# Patient Record
Sex: Female | Born: 1940
Health system: Southern US, Community
[De-identification: ages and names within clinical notes are randomized; demographics above are authoritative.]

## PROBLEM LIST (undated history)

## (undated) DIAGNOSIS — E079 Disorder of thyroid, unspecified: Secondary | ICD-10-CM

## (undated) DIAGNOSIS — Z923 Personal history of irradiation: Secondary | ICD-10-CM

## (undated) DIAGNOSIS — C50919 Malignant neoplasm of unspecified site of unspecified female breast: Secondary | ICD-10-CM

## (undated) DIAGNOSIS — M1990 Unspecified osteoarthritis, unspecified site: Secondary | ICD-10-CM

## (undated) DIAGNOSIS — J45909 Unspecified asthma, uncomplicated: Secondary | ICD-10-CM

## (undated) DIAGNOSIS — E039 Hypothyroidism, unspecified: Secondary | ICD-10-CM

## (undated) DIAGNOSIS — D649 Anemia, unspecified: Secondary | ICD-10-CM

## (undated) DIAGNOSIS — R112 Nausea with vomiting, unspecified: Secondary | ICD-10-CM

## (undated) DIAGNOSIS — K759 Inflammatory liver disease, unspecified: Secondary | ICD-10-CM

## (undated) DIAGNOSIS — Z9889 Other specified postprocedural states: Secondary | ICD-10-CM

## (undated) DIAGNOSIS — I10 Essential (primary) hypertension: Secondary | ICD-10-CM

## (undated) DIAGNOSIS — J189 Pneumonia, unspecified organism: Secondary | ICD-10-CM

## (undated) DIAGNOSIS — E7800 Pure hypercholesterolemia, unspecified: Secondary | ICD-10-CM

## (undated) HISTORY — PX: ABDOMINAL HYSTERECTOMY: SHX81

## (undated) HISTORY — PX: COLONOSCOPY: SHX174

## (undated) HISTORY — PX: CATARACT EXTRACTION W/ INTRAOCULAR LENS IMPLANT: SHX1309

## (undated) HISTORY — PX: TONSILLECTOMY: SUR1361

---
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
## 1971-09-14 HISTORY — PX: THYROIDECTOMY, PARTIAL: SHX18

## 1988-09-13 DIAGNOSIS — Z923 Personal history of irradiation: Secondary | ICD-10-CM

## 1988-09-13 DIAGNOSIS — C50919 Malignant neoplasm of unspecified site of unspecified female breast: Secondary | ICD-10-CM

## 1988-09-13 HISTORY — PX: BREAST LUMPECTOMY: SHX2

## 1988-09-13 HISTORY — DX: Malignant neoplasm of unspecified site of unspecified female breast: C50.919

## 1988-09-13 HISTORY — DX: Personal history of irradiation: Z92.3

## 1998-03-14 ENCOUNTER — Ambulatory Visit: Admission: RE | Admit: 1998-03-14 | Discharge: 1998-03-14 | Payer: Self-pay | Admitting: Family Medicine

## 1999-04-23 ENCOUNTER — Ambulatory Visit (HOSPITAL_COMMUNITY): Admission: RE | Admit: 1999-04-23 | Discharge: 1999-04-23 | Payer: Self-pay | Admitting: Family Medicine

## 1999-04-23 ENCOUNTER — Encounter: Payer: Self-pay | Admitting: Family Medicine

## 2000-04-26 ENCOUNTER — Ambulatory Visit (HOSPITAL_COMMUNITY): Admission: RE | Admit: 2000-04-26 | Discharge: 2000-04-26 | Payer: Self-pay | Admitting: Family Medicine

## 2000-04-26 ENCOUNTER — Encounter: Payer: Self-pay | Admitting: Family Medicine

## 2001-05-01 ENCOUNTER — Ambulatory Visit (HOSPITAL_COMMUNITY): Admission: RE | Admit: 2001-05-01 | Discharge: 2001-05-01 | Payer: Self-pay | Admitting: Family Medicine

## 2001-05-01 ENCOUNTER — Encounter: Payer: Self-pay | Admitting: Family Medicine

## 2002-05-08 ENCOUNTER — Ambulatory Visit (HOSPITAL_COMMUNITY): Admission: RE | Admit: 2002-05-08 | Discharge: 2002-05-08 | Payer: Self-pay | Admitting: Family Medicine

## 2002-05-08 ENCOUNTER — Encounter: Payer: Self-pay | Admitting: Family Medicine

## 2003-05-10 ENCOUNTER — Encounter: Payer: Self-pay | Admitting: Family Medicine

## 2003-05-10 ENCOUNTER — Ambulatory Visit (HOSPITAL_COMMUNITY): Admission: RE | Admit: 2003-05-10 | Discharge: 2003-05-10 | Payer: Self-pay | Admitting: Family Medicine

## 2004-05-26 ENCOUNTER — Ambulatory Visit (HOSPITAL_COMMUNITY): Admission: RE | Admit: 2004-05-26 | Discharge: 2004-05-26 | Payer: Self-pay | Admitting: Family Medicine

## 2005-07-14 ENCOUNTER — Ambulatory Visit (HOSPITAL_COMMUNITY): Admission: RE | Admit: 2005-07-14 | Discharge: 2005-07-14 | Payer: Self-pay | Admitting: Family Medicine

## 2006-08-08 ENCOUNTER — Ambulatory Visit (HOSPITAL_COMMUNITY): Admission: RE | Admit: 2006-08-08 | Discharge: 2006-08-08 | Payer: Self-pay | Admitting: Family Medicine

## 2007-08-16 ENCOUNTER — Ambulatory Visit (HOSPITAL_COMMUNITY): Admission: RE | Admit: 2007-08-16 | Discharge: 2007-08-16 | Payer: Self-pay | Admitting: Family Medicine

## 2008-08-20 ENCOUNTER — Ambulatory Visit (HOSPITAL_COMMUNITY): Admission: RE | Admit: 2008-08-20 | Discharge: 2008-08-20 | Payer: Self-pay | Admitting: Family Medicine

## 2009-09-01 ENCOUNTER — Ambulatory Visit (HOSPITAL_COMMUNITY): Admission: RE | Admit: 2009-09-01 | Discharge: 2009-09-01 | Payer: Self-pay | Admitting: Family Medicine

## 2010-09-08 ENCOUNTER — Ambulatory Visit (HOSPITAL_COMMUNITY)
Admission: RE | Admit: 2010-09-08 | Discharge: 2010-09-08 | Payer: Self-pay | Source: Home / Self Care | Attending: Family Medicine | Admitting: Family Medicine

## 2011-08-04 ENCOUNTER — Other Ambulatory Visit (HOSPITAL_COMMUNITY): Payer: Self-pay | Admitting: Family Medicine

## 2011-08-04 DIAGNOSIS — Z1231 Encounter for screening mammogram for malignant neoplasm of breast: Secondary | ICD-10-CM

## 2011-09-10 ENCOUNTER — Ambulatory Visit (HOSPITAL_COMMUNITY)
Admission: RE | Admit: 2011-09-10 | Discharge: 2011-09-10 | Disposition: A | Discharge status: Home / Self Care | Payer: Medicare Other | Source: Ambulatory Visit | Attending: Family Medicine | Admitting: Family Medicine

## 2011-09-10 DIAGNOSIS — Z1231 Encounter for screening mammogram for malignant neoplasm of breast: Secondary | ICD-10-CM | POA: Insufficient documentation

## 2011-09-15 ENCOUNTER — Other Ambulatory Visit: Payer: Self-pay | Admitting: Family Medicine

## 2011-09-15 DIAGNOSIS — R221 Localized swelling, mass and lump, neck: Secondary | ICD-10-CM

## 2011-09-16 ENCOUNTER — Ambulatory Visit
Admission: RE | Admit: 2011-09-16 | Discharge: 2011-09-16 | Disposition: A | Discharge status: Home / Self Care | Payer: Medicare Other | Source: Ambulatory Visit | Attending: Family Medicine | Admitting: Family Medicine

## 2011-09-16 DIAGNOSIS — R221 Localized swelling, mass and lump, neck: Secondary | ICD-10-CM

## 2011-09-21 ENCOUNTER — Other Ambulatory Visit: Payer: Self-pay | Admitting: Otolaryngology

## 2011-09-21 ENCOUNTER — Other Ambulatory Visit (HOSPITAL_COMMUNITY)
Admission: RE | Admit: 2011-09-21 | Discharge: 2011-09-21 | Disposition: A | Discharge status: Home / Self Care | Payer: Medicare Other | Source: Ambulatory Visit | Attending: Otolaryngology | Admitting: Otolaryngology

## 2011-09-21 DIAGNOSIS — R220 Localized swelling, mass and lump, head: Secondary | ICD-10-CM | POA: Insufficient documentation

## 2011-10-11 ENCOUNTER — Other Ambulatory Visit: Payer: Self-pay | Admitting: Otolaryngology

## 2011-10-21 ENCOUNTER — Ambulatory Visit
Admission: RE | Admit: 2011-10-21 | Discharge: 2011-10-21 | Disposition: A | Discharge status: Home / Self Care | Payer: Medicare Other | Source: Ambulatory Visit | Attending: Otolaryngology | Admitting: Otolaryngology

## 2011-10-21 ENCOUNTER — Other Ambulatory Visit: Payer: Self-pay | Admitting: Otolaryngology

## 2011-10-21 DIAGNOSIS — D333 Benign neoplasm of cranial nerves: Secondary | ICD-10-CM

## 2011-10-21 MED ORDER — IOHEXOL 300 MG/ML  SOLN
75.0000 mL | Freq: Once | INTRAMUSCULAR | Status: AC | PRN
  Administered 2011-10-21: 75 mL via INTRAVENOUS

## 2012-05-10 ENCOUNTER — Other Ambulatory Visit: Payer: Self-pay | Admitting: Otolaryngology

## 2012-05-10 DIAGNOSIS — R591 Generalized enlarged lymph nodes: Secondary | ICD-10-CM

## 2012-05-19 ENCOUNTER — Ambulatory Visit
Admission: RE | Admit: 2012-05-19 | Discharge: 2012-05-19 | Disposition: A | Discharge status: Home / Self Care | Payer: Medicare Other | Source: Ambulatory Visit | Attending: Otolaryngology | Admitting: Otolaryngology

## 2012-05-19 DIAGNOSIS — R591 Generalized enlarged lymph nodes: Secondary | ICD-10-CM

## 2012-05-19 MED ORDER — IOHEXOL 300 MG/ML  SOLN
75.0000 mL | Freq: Once | INTRAMUSCULAR | Status: AC | PRN
  Administered 2012-05-19: 75 mL via INTRAVENOUS

## 2012-06-09 ENCOUNTER — Other Ambulatory Visit: Payer: Self-pay | Admitting: Family Medicine

## 2012-06-09 DIAGNOSIS — R0989 Other specified symptoms and signs involving the circulatory and respiratory systems: Secondary | ICD-10-CM

## 2012-06-19 ENCOUNTER — Ambulatory Visit
Admission: RE | Admit: 2012-06-19 | Discharge: 2012-06-19 | Disposition: A | Discharge status: Home / Self Care | Payer: Medicare Other | Source: Ambulatory Visit | Attending: Family Medicine | Admitting: Family Medicine

## 2012-06-19 DIAGNOSIS — R0989 Other specified symptoms and signs involving the circulatory and respiratory systems: Secondary | ICD-10-CM

## 2012-08-14 ENCOUNTER — Other Ambulatory Visit (HOSPITAL_COMMUNITY): Payer: Self-pay | Admitting: Family Medicine

## 2012-08-14 DIAGNOSIS — Z1231 Encounter for screening mammogram for malignant neoplasm of breast: Secondary | ICD-10-CM

## 2012-09-11 ENCOUNTER — Ambulatory Visit (HOSPITAL_COMMUNITY)
Admission: RE | Admit: 2012-09-11 | Discharge: 2012-09-11 | Disposition: A | Discharge status: Home / Self Care | Payer: Medicare Other | Source: Ambulatory Visit | Attending: Family Medicine | Admitting: Family Medicine

## 2012-09-11 DIAGNOSIS — Z1231 Encounter for screening mammogram for malignant neoplasm of breast: Secondary | ICD-10-CM | POA: Insufficient documentation

## 2013-02-08 IMAGING — US US CAROTID DUPLEX BILAT
1 series · 13 of 24 positions shown · non-contrast
Comparison: Neck CT 05/19/2012, thyroid ultrasound 09/16/2011

CLINICAL DATA: Left carotid bruit.  History of a nerve sheath
tumor.

BILATERAL CAROTID DUPLEX ULTRASOUND
TECHNIQUE: Gray scale imaging, color Doppler and duplex ultrasound
was performed of bilateral carotid and vertebral arteries in the
neck.

[Series 1: us carotid duplex bilat · 0.08mm/px · 13 of 73 slices shown]
[im 1/73]
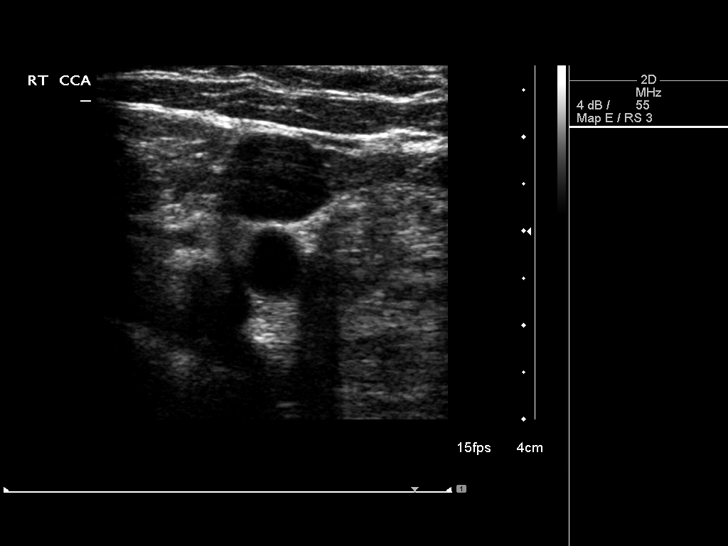
[im 7/73]
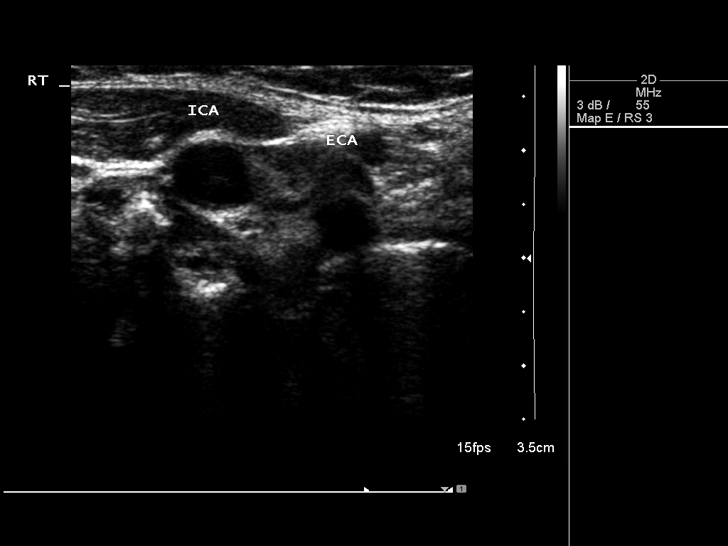
[im 13/73]
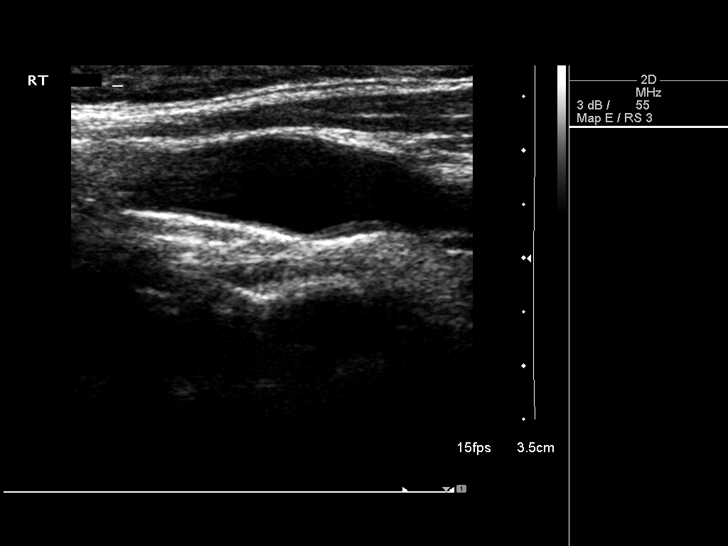
[im 19/73]
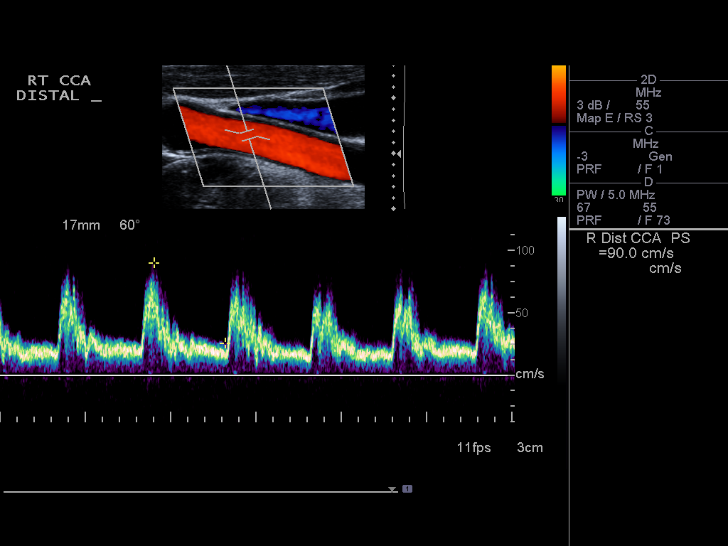
[im 26/73]
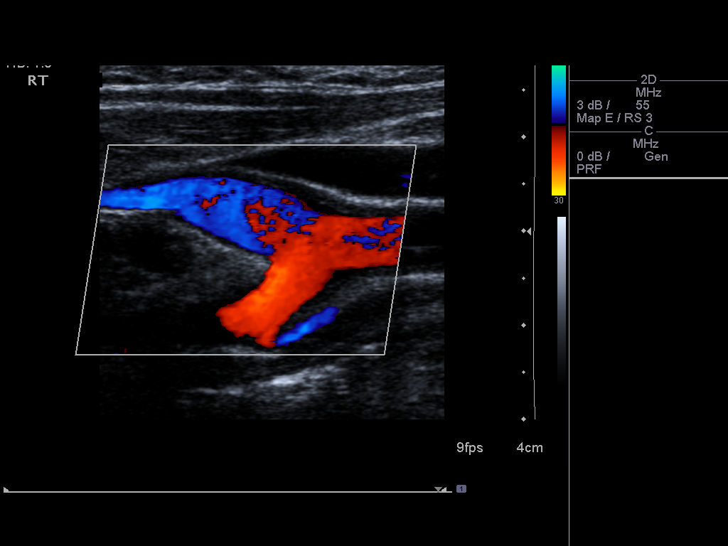
[im 32/73]
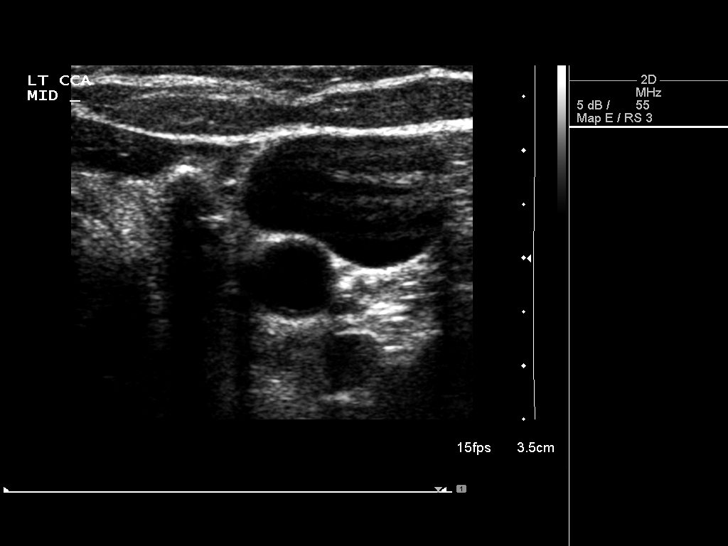
[im 38/73]
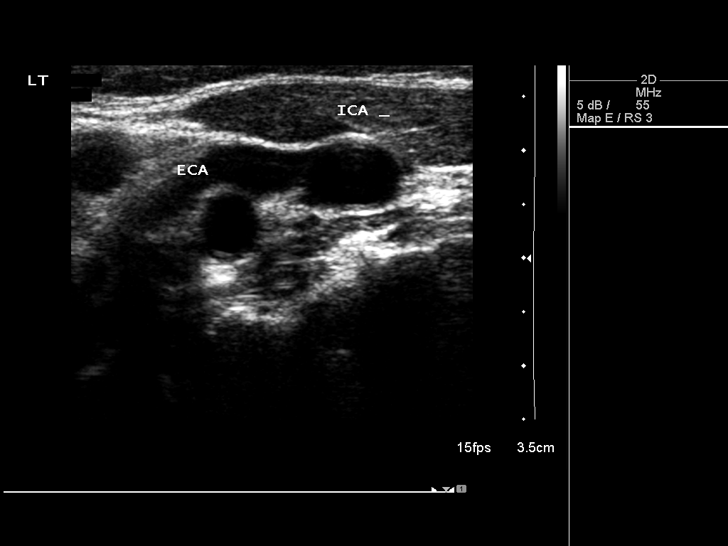
[im 41/73]
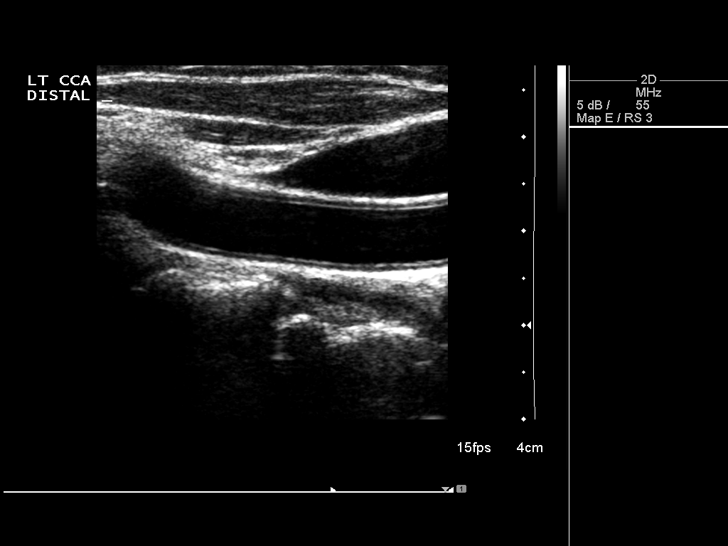
[im 47/73]
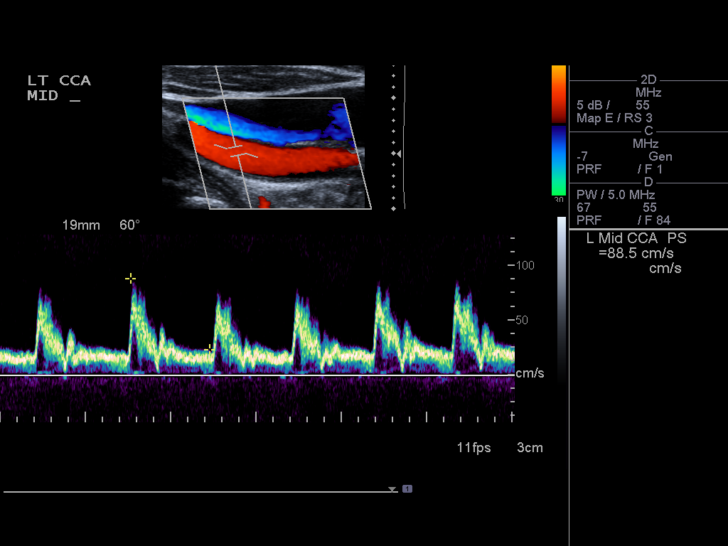
[im 54/73]
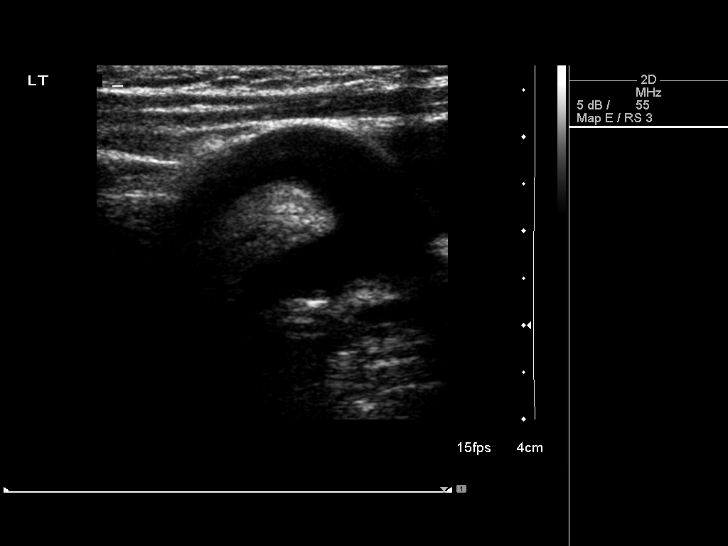
[im 60/73]
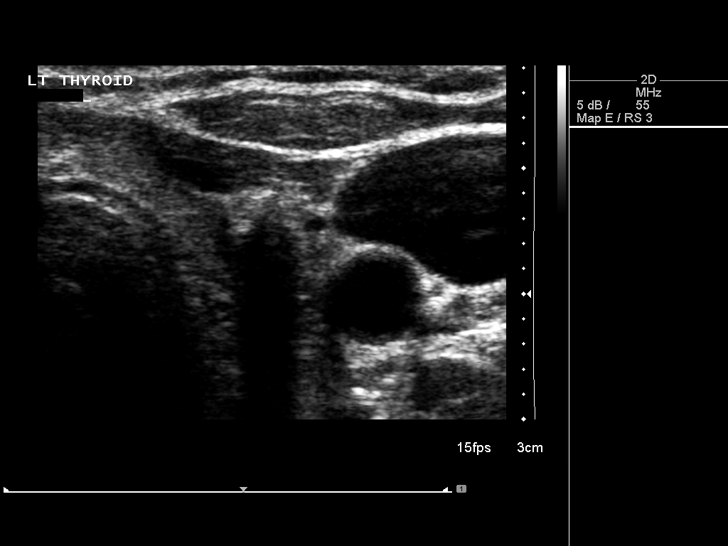
[im 66/73]
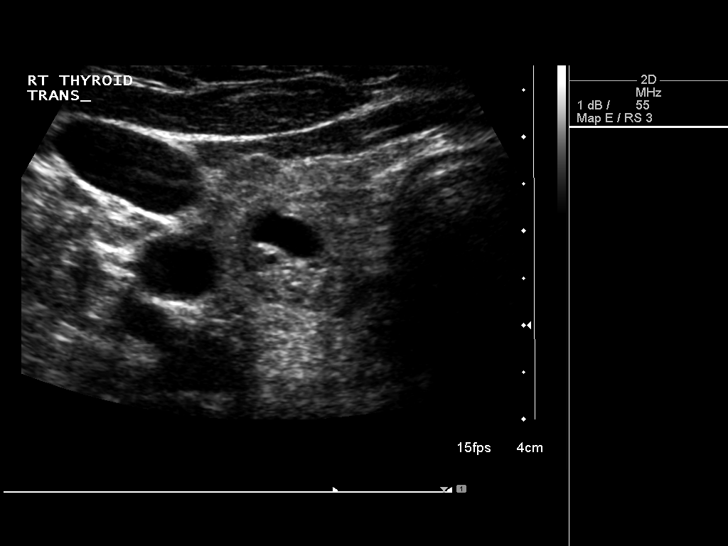
[im 73/73]
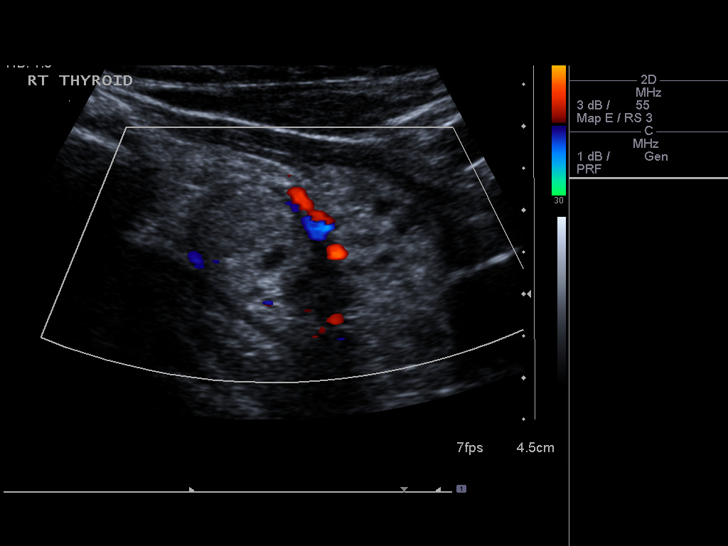

[13 of 24 positions shown; findings below may reference images not displayed]

Criteria:  Quantification of carotid stenosis is based on velocity
parameters that correlate the residual internal carotid diameter
with NASCET-based stenosis levels, using the diameter of the distal
internal carotid lumen as the denominator for stenosis measurement.

The following velocity measurements were obtained:

                 PEAK SYSTOLIC/END DIASTOLIC
RIGHT
ICA:                        79/31cm/sec
CCA:                        104/28cm/sec
SYSTOLIC ICA/CCA RATIO:
DIASTOLIC ICA/CCA RATIO:
ECA:                        76cm/sec

LEFT
ICA:                        88/35cm/sec
CCA:                        89/23cm/sec
SYSTOLIC ICA/CCA RATIO:
DIASTOLIC ICA/CCA RATIO:
ECA:                        53cm/sec
FINDINGS: RIGHT CAROTID ARTERY: Mild intimal thickening and left and 5%
diameter echogenic plaque at the carotid bulb without significant
echogenic shadowing plaque.

RIGHT VERTEBRAL ARTERY:  Antegrade

LEFT CAROTID ARTERY: No significant plaque formation by gray scale
criteria.

LEFT VERTEBRAL ARTERY:  Antegrade

Normal waveform morphologies are noted within the internal and
external carotid arterial systems bilaterally.

Thyroid nodules are incidentally partly visualized, previously
evaluated 09/16/2011.
IMPRESSION: No sonographic evidence for hemodynamically significant stenosis in
either carotid arterial system.

## 2013-05-21 ENCOUNTER — Other Ambulatory Visit: Payer: Self-pay | Admitting: Otolaryngology

## 2013-05-21 DIAGNOSIS — R599 Enlarged lymph nodes, unspecified: Secondary | ICD-10-CM

## 2013-06-04 ENCOUNTER — Ambulatory Visit
Admission: RE | Admit: 2013-06-04 | Discharge: 2013-06-04 | Disposition: A | Discharge status: Home / Self Care | Payer: Medicare Other | Source: Ambulatory Visit | Attending: Otolaryngology | Admitting: Otolaryngology

## 2013-06-04 DIAGNOSIS — R599 Enlarged lymph nodes, unspecified: Secondary | ICD-10-CM

## 2013-06-04 MED ORDER — IOHEXOL 300 MG/ML  SOLN
75.0000 mL | Freq: Once | INTRAMUSCULAR | Status: AC | PRN
  Administered 2013-06-04: 75 mL via INTRAVENOUS

## 2013-08-15 ENCOUNTER — Other Ambulatory Visit (HOSPITAL_COMMUNITY): Payer: Self-pay | Admitting: Family Medicine

## 2013-08-15 DIAGNOSIS — Z1231 Encounter for screening mammogram for malignant neoplasm of breast: Secondary | ICD-10-CM

## 2013-09-12 ENCOUNTER — Ambulatory Visit (HOSPITAL_COMMUNITY)
Admission: RE | Admit: 2013-09-12 | Discharge: 2013-09-12 | Disposition: A | Discharge status: Home / Self Care | Payer: Medicare Other | Source: Ambulatory Visit | Attending: Family Medicine | Admitting: Family Medicine

## 2013-09-12 DIAGNOSIS — Z1231 Encounter for screening mammogram for malignant neoplasm of breast: Secondary | ICD-10-CM

## 2014-09-23 ENCOUNTER — Other Ambulatory Visit (HOSPITAL_COMMUNITY): Payer: Self-pay | Admitting: Family Medicine

## 2014-09-23 DIAGNOSIS — Z1231 Encounter for screening mammogram for malignant neoplasm of breast: Secondary | ICD-10-CM

## 2014-11-14 ENCOUNTER — Ambulatory Visit (HOSPITAL_COMMUNITY)
Admission: RE | Admit: 2014-11-14 | Discharge: 2014-11-14 | Disposition: A | Discharge status: Home / Self Care | Payer: Medicare Other | Source: Ambulatory Visit | Attending: Family Medicine | Admitting: Family Medicine

## 2014-11-14 DIAGNOSIS — Z1231 Encounter for screening mammogram for malignant neoplasm of breast: Secondary | ICD-10-CM | POA: Diagnosis not present

## 2014-11-18 ENCOUNTER — Ambulatory Visit (HOSPITAL_COMMUNITY): Payer: Self-pay

## 2015-10-20 ENCOUNTER — Other Ambulatory Visit: Payer: Self-pay

## 2015-10-20 DIAGNOSIS — Z1231 Encounter for screening mammogram for malignant neoplasm of breast: Secondary | ICD-10-CM

## 2015-10-20 DIAGNOSIS — Z853 Personal history of malignant neoplasm of breast: Secondary | ICD-10-CM

## 2015-12-05 ENCOUNTER — Ambulatory Visit
Admission: RE | Admit: 2015-12-05 | Discharge: 2015-12-05 | Disposition: A | Discharge status: Home / Self Care | Payer: Medicare Other | Source: Ambulatory Visit

## 2015-12-05 DIAGNOSIS — Z853 Personal history of malignant neoplasm of breast: Secondary | ICD-10-CM

## 2015-12-05 DIAGNOSIS — Z1231 Encounter for screening mammogram for malignant neoplasm of breast: Secondary | ICD-10-CM

## 2015-12-19 ENCOUNTER — Encounter (HOSPITAL_BASED_OUTPATIENT_CLINIC_OR_DEPARTMENT_OTHER): Payer: Self-pay | Admitting: *Deleted

## 2015-12-19 ENCOUNTER — Emergency Department (HOSPITAL_BASED_OUTPATIENT_CLINIC_OR_DEPARTMENT_OTHER)
Admission: EM | Admit: 2015-12-19 | Discharge: 2015-12-19 | Disposition: A | Discharge status: Home / Self Care | Payer: Medicare Other | Attending: Emergency Medicine | Admitting: Emergency Medicine

## 2015-12-19 DIAGNOSIS — E079 Disorder of thyroid, unspecified: Secondary | ICD-10-CM | POA: Diagnosis not present

## 2015-12-19 DIAGNOSIS — R112 Nausea with vomiting, unspecified: Secondary | ICD-10-CM | POA: Diagnosis not present

## 2015-12-19 DIAGNOSIS — J3489 Other specified disorders of nose and nasal sinuses: Secondary | ICD-10-CM | POA: Insufficient documentation

## 2015-12-19 DIAGNOSIS — R42 Dizziness and giddiness: Secondary | ICD-10-CM | POA: Diagnosis not present

## 2015-12-19 DIAGNOSIS — E7800 Pure hypercholesterolemia, unspecified: Secondary | ICD-10-CM | POA: Diagnosis not present

## 2015-12-19 HISTORY — DX: Disorder of thyroid, unspecified: E07.9

## 2015-12-19 HISTORY — DX: Pure hypercholesterolemia, unspecified: E78.00

## 2015-12-19 LAB — CBC WITH DIFFERENTIAL/PLATELET
BASOS ABS: 0 10*3/uL (ref 0.0–0.1)
Basophils Relative: 0 %
EOS PCT: 2 %
Eosinophils Absolute: 0.2 10*3/uL (ref 0.0–0.7)
HCT: 42.9 % (ref 36.0–46.0)
HEMOGLOBIN: 14.4 g/dL (ref 12.0–15.0)
LYMPHS ABS: 1.5 10*3/uL (ref 0.7–4.0)
LYMPHS PCT: 19 %
MCH: 31.2 pg (ref 26.0–34.0)
MCHC: 33.6 g/dL (ref 30.0–36.0)
MCV: 93.1 fL (ref 78.0–100.0)
Monocytes Absolute: 0.4 10*3/uL (ref 0.1–1.0)
Monocytes Relative: 5 %
NEUTROS ABS: 5.9 10*3/uL (ref 1.7–7.7)
NEUTROS PCT: 74 %
Platelets: 163 10*3/uL (ref 150–400)
RBC: 4.61 MIL/uL (ref 3.87–5.11)
RDW: 12.8 % (ref 11.5–15.5)
WBC: 8 10*3/uL (ref 4.0–10.5)

## 2015-12-19 LAB — BASIC METABOLIC PANEL
ANION GAP: 9 (ref 5–15)
BUN: 22 mg/dL — ABNORMAL HIGH (ref 6–20)
CO2: 26 mmol/L (ref 22–32)
Calcium: 9.4 mg/dL (ref 8.9–10.3)
Chloride: 103 mmol/L (ref 101–111)
Creatinine, Ser: 0.87 mg/dL (ref 0.44–1.00)
GFR calc non Af Amer: >60 mL/min (ref >60)
Glucose, Bld: 102 mg/dL — ABNORMAL HIGH (ref 65–99)
POTASSIUM: 4 mmol/L (ref 3.5–5.1)
SODIUM: 138 mmol/L (ref 135–145)

## 2015-12-19 LAB — URINE MICROSCOPIC-ADD ON
RBC / HPF: NONE SEEN RBC/hpf (ref 0–5)
SQUAMOUS EPITHELIAL / LPF: NONE SEEN
WBC UA: NONE SEEN WBC/hpf (ref 0–5)

## 2015-12-19 LAB — URINALYSIS, ROUTINE W REFLEX MICROSCOPIC
Bilirubin Urine: NEGATIVE
Glucose, UA: NEGATIVE mg/dL
Hgb urine dipstick: NEGATIVE
Ketones, ur: NEGATIVE mg/dL
NITRITE: NEGATIVE
Protein, ur: NEGATIVE mg/dL
SPECIFIC GRAVITY, URINE: 1.004 — AB (ref 1.005–1.030)
pH: 7 (ref 5.0–8.0)

## 2015-12-19 MED ORDER — MECLIZINE HCL 25 MG PO TABS: 25.0000 mg | ORAL_TABLET | Freq: Three times a day (TID) | ORAL | Status: DC | PRN

## 2015-12-19 MED ORDER — MECLIZINE HCL 25 MG PO TABS
25.0000 mg | ORAL_TABLET | Freq: Once | ORAL | Status: AC
  Administered 2015-12-19: 25 mg via ORAL
  Filled 2015-12-19: qty 1

## 2015-12-19 MED FILL — MECLIZINE 25 MG TABLET: 25 | 8 days supply | Qty: 20 | Fill #0

## 2015-12-19 NOTE — ED Notes (Signed)
PA at bedside.

## 2015-12-19 NOTE — ED Provider Notes (Signed)
CSN: [id number]     Arrival date & time 12/19/15  1425 History   First MD Initiated Contact with Patient 12/19/15 1432     Chief Complaint  Patient presents with  . Dizziness   HPI  Ms. Derring is a 75 year old female with a past medical history of hyperlipidemia and thyroid disease presenting with dizziness. Patient states she was out to breakfast when she bent forward in her vehicle to pick something up. She states upon sitting upward she had a sensation of her environment spinning around her. She states that she sat still for a period of time and this seemed to resolve symptoms. She reports recurrent symptoms when she returned home upon walking in the front door. She states that this caused her some nausea which resulted in 2 episodes of vomiting. She rested on the couch which seemed to resolve the symptoms. She denies sensation of room spinning currently; states that she feels "a little wobbly still. She has a history of vertigo and states that this feels similar. She also endorses rhinorrhea and states that her allergies seem to exacerbate her symptoms. She has tried over-the-counter Allegra for this. She does not having meclizine at home and has not taken anything for her symptoms. She has no other complaints at this time. She denies facial droop, speech difficulty, vision changes, gait instability or extremity numbness/weakness.   Past Medical History  Diagnosis Date  . Thyroid disease   . High cholesterol    History reviewed. No pertinent past surgical history. No family history on file. Social History  Substance Use Topics  . Smoking status: Never Smoker   . Smokeless tobacco: None  . Alcohol Use: Yes   OB History    No data available     Review of Systems  Constitutional: Negative for fever and chills.  HENT: Positive for rhinorrhea.   Neurological: Positive for dizziness. Negative for syncope, facial asymmetry, speech difficulty, weakness, numbness and headaches.  All other  systems reviewed and are negative.     Allergies  Codeine  Home Medications   Prior to Admission medications   Medication Sig Start Date End Date Taking? Authorizing Provider  Levothyroxine Sodium (SYNTHROID PO) Take by mouth.   Yes Historical Provider, MD  SIMVASTATIN PO Take by mouth.   Yes Historical Provider, MD  meclizine (ANTIVERT) 25 MG tablet Take 1 tablet (25 mg total) by mouth 3 (three) times daily as needed for dizziness. 12/19/15   Jola Schmidt, MD   BP 171/104 mmHg  Pulse 67  Temp(Src) 98 F (36.7 C) (Oral)  Resp 18  Ht 5\' 4"  (1.626 m)  Wt 84.823 kg  BMI 32.08 kg/m2  SpO2 100% Physical Exam  Constitutional: She appears well-developed and well-nourished. No distress.  HENT:  Head: Normocephalic and atraumatic.  Mouth/Throat: Oropharynx is clear and moist. No oropharyngeal exudate.  Eyes: Conjunctivae and EOM are normal. Pupils are equal, round, and reactive to light. Right eye exhibits no discharge. Left eye exhibits no discharge.  No nystagmus  Neck: Normal range of motion. Neck supple. No rigidity.  Cardiovascular: Normal rate, regular rhythm and normal heart sounds.   Pulmonary/Chest: Effort normal and breath sounds normal. No respiratory distress.  Abdominal: Soft. There is no tenderness. There is no rebound and no guarding.  Musculoskeletal: Normal range of motion.  Neurological: She is alert. No cranial nerve deficit. She exhibits normal muscle tone. Coordination normal.  Cranial nerves 3-12 tested and intact. 5/5 strength of all major muscle groups. Sensation to  light touch intact throughout. Finger to nose coordinated. Walks with a steady gait unassisted.  Skin: Skin is warm and dry.  Psychiatric: She has a normal mood and affect. Her behavior is normal.  Nursing note and vitals reviewed.   ED Course  Procedures (including critical care time) Labs Review Labs Reviewed  BASIC METABOLIC PANEL - Abnormal; Notable for the following:    Glucose, Bld 102  (*)    BUN 22 (*)    All other components within normal limits  URINALYSIS, ROUTINE W REFLEX MICROSCOPIC (NOT AT [facility]) - Abnormal; Notable for the following:    Specific Gravity, Urine 1.004 (*)    Leukocytes, UA TRACE (*)    All other components within normal limits  URINE MICROSCOPIC-ADD ON - Abnormal; Notable for the following:    Bacteria, UA RARE (*)    All other components within normal limits  CBC WITH DIFFERENTIAL/PLATELET    Imaging Review No results found. I have personally reviewed and evaluated these images and lab results as part of my medical decision-making.   EKG Interpretation None      MDM   Final diagnoses:  Vertigo   75 year old female sent in with two episodes of room spinning dizziness today. Also endorses some rhinorrhea due to seasonal allergies. History of vertigo in the past. Afebrile and hemodynamically stable. Hypertensive but not on hypertension medications. Nonfocal neuro exam. No nystagmus noted. Patient is witnessed ambulating to the emergency department with a steady gait and no recurrent symptoms. Blood work largely unremarkable. Her bacteria with trace leukocytes; we'll send for culture and treat if positive growth. No indication for imaging at this time. Treated with meclizine in emergency department she reports resolved the "wobbly" feeling. Patient is stable for outpatient follow-up and meclizine prescriptions given. Patient will need to follow up with her PCP for BP recheck in 1 week. Patient seen by Dr. Venora Maples who agrees with this assessment and plan for discharge. Return precautions given in discharge paperwork and discussed with pt at bedside. Pt stable for discharge    Josephina Gip, PA-C 12/19/15 [place], MD 12/22/15 [phone]

## 2015-12-19 NOTE — ED Notes (Signed)
Pt ambulatory in dept with steady gait

## 2015-12-19 NOTE — ED Notes (Addendum)
Dizziness this am. Hx of same years ago that was diagnosed as inner ear problem. She has been taking otc sinus medication.

## 2015-12-19 NOTE — Discharge Instructions (Signed)
Benign Positional Vertigo Vertigo is the feeling that you or your surroundings are moving when they are not. Benign positional vertigo is the most common form of vertigo. The cause of this condition is not serious (is benign). This condition is triggered by certain movements and positions (is positional). This condition can be dangerous if it occurs while you are doing something that could endanger you or others, such as driving.  CAUSES In many cases, the cause of this condition is not known. It may be caused by a disturbance in an area of the inner ear that helps your brain to sense movement and balance. This disturbance can be caused by a viral infection (labyrinthitis), head injury, or repetitive motion. RISK FACTORS This condition is more likely to develop in:  Women.  People who are 50 years of age or older. SYMPTOMS Symptoms of this condition usually happen when you move your head or your eyes in different directions. Symptoms may start suddenly, and they usually last for less than a minute. Symptoms may include:  Loss of balance and falling.  Feeling like you are spinning or moving.  Feeling like your surroundings are spinning or moving.  Nausea and vomiting.  Blurred vision.  Dizziness.  Involuntary eye movement (nystagmus). Symptoms can be mild and cause only slight annoyance, or they can be severe and interfere with daily life. Episodes of benign positional vertigo may return (recur) over time, and they may be triggered by certain movements. Symptoms may improve over time. DIAGNOSIS This condition is usually diagnosed by medical history and a physical exam of the head, neck, and ears. You may be referred to a health care provider who specializes in ear, nose, and throat (ENT) problems (otolaryngologist) or a provider who specializes in disorders of the nervous system (neurologist). You may have additional testing, including:  MRI.  A CT scan.  Eye movement tests. Your  health care provider may ask you to change positions quickly while he or she watches you for symptoms of benign positional vertigo, such as nystagmus. Eye movement may be tested with an electronystagmogram (ENG), caloric stimulation, the Dix-Hallpike test, or the roll test.  An electroencephalogram (EEG). This records electrical activity in your brain.  Hearing tests. TREATMENT Usually, your health care provider will treat this by moving your head in specific positions to adjust your inner ear back to normal. Surgery may be needed in severe cases, but this is rare. In some cases, benign positional vertigo may resolve on its own in 2-4 weeks. HOME CARE INSTRUCTIONS Safety  Move slowly.Avoid sudden body or head movements.  Avoid driving.  Avoid operating heavy machinery.  Avoid doing any tasks that would be dangerous to you or others if a vertigo episode would occur.  If you have trouble walking or keeping your balance, try using a cane for stability. If you feel dizzy or unstable, sit down right away.  Return to your normal activities as told by your health care provider. Ask your health care provider what activities are safe for you. General Instructions  Take over-the-counter and prescription medicines only as told by your health care provider.  Avoid certain positions or movements as told by your health care provider.  Drink enough fluid to keep your urine clear or pale yellow.  Keep all follow-up visits as told by your health care provider. This is important. SEEK MEDICAL CARE IF:  You have a fever.  Your condition gets worse or you develop new symptoms.  Your family or friends   notice any behavioral changes.  Your nausea or vomiting gets worse.  You have numbness or a "pins and needles" sensation. SEEK IMMEDIATE MEDICAL CARE IF:  You have difficulty speaking or moving.  You are always dizzy.  You faint.  You develop severe headaches.  You have weakness in your  legs or arms.  You have changes in your hearing or vision.  You develop a stiff neck.  You develop sensitivity to light.   This information is not intended to replace advice given to you by your health care provider. Make sure you discuss any questions you have with your health care provider.   Document Released: 06/07/2006 Document Revised: 05/21/2015 Document Reviewed: 12/23/2014 Elsevier Interactive Patient Education 2016 [employer]  

## 2016-11-09 ENCOUNTER — Other Ambulatory Visit: Payer: Self-pay | Admitting: Family Medicine

## 2016-11-09 DIAGNOSIS — Z1231 Encounter for screening mammogram for malignant neoplasm of breast: Secondary | ICD-10-CM

## 2016-12-24 ENCOUNTER — Ambulatory Visit
Admission: RE | Admit: 2016-12-24 | Discharge: 2016-12-24 | Disposition: A | Discharge status: Home / Self Care | Payer: Medicare Other | Source: Ambulatory Visit | Attending: Family Medicine | Admitting: Family Medicine

## 2016-12-24 DIAGNOSIS — Z1231 Encounter for screening mammogram for malignant neoplasm of breast: Secondary | ICD-10-CM

## 2016-12-24 HISTORY — DX: Personal history of irradiation: Z92.3

## 2016-12-27 ENCOUNTER — Other Ambulatory Visit: Payer: Self-pay | Admitting: Family Medicine

## 2016-12-27 DIAGNOSIS — R928 Other abnormal and inconclusive findings on diagnostic imaging of breast: Secondary | ICD-10-CM

## 2017-01-10 ENCOUNTER — Other Ambulatory Visit: Payer: Self-pay | Admitting: Family Medicine

## 2017-01-10 ENCOUNTER — Ambulatory Visit
Admission: RE | Admit: 2017-01-10 | Discharge: 2017-01-10 | Disposition: A | Discharge status: Home / Self Care | Payer: Medicare Other | Source: Ambulatory Visit | Attending: Family Medicine | Admitting: Family Medicine

## 2017-01-10 DIAGNOSIS — N6310 Unspecified lump in the right breast, unspecified quadrant: Secondary | ICD-10-CM

## 2017-01-10 DIAGNOSIS — R928 Other abnormal and inconclusive findings on diagnostic imaging of breast: Secondary | ICD-10-CM

## 2017-01-11 ENCOUNTER — Other Ambulatory Visit: Payer: Self-pay | Admitting: Family Medicine

## 2017-01-11 ENCOUNTER — Ambulatory Visit
Admission: RE | Admit: 2017-01-11 | Discharge: 2017-01-11 | Disposition: A | Discharge status: Home / Self Care | Payer: Medicare Other | Source: Ambulatory Visit | Attending: Family Medicine | Admitting: Family Medicine

## 2017-01-11 DIAGNOSIS — N6310 Unspecified lump in the right breast, unspecified quadrant: Secondary | ICD-10-CM

## 2017-01-11 HISTORY — PX: BREAST BIOPSY: SHX20

## 2017-11-30 ENCOUNTER — Other Ambulatory Visit: Payer: Self-pay | Admitting: Family Medicine

## 2017-11-30 DIAGNOSIS — Z1231 Encounter for screening mammogram for malignant neoplasm of breast: Secondary | ICD-10-CM

## 2017-12-27 ENCOUNTER — Ambulatory Visit
Admission: RE | Admit: 2017-12-27 | Discharge: 2017-12-27 | Disposition: A | Discharge status: Home / Self Care | Payer: Medicare Other | Source: Ambulatory Visit | Attending: Family Medicine | Admitting: Family Medicine

## 2017-12-27 DIAGNOSIS — Z1231 Encounter for screening mammogram for malignant neoplasm of breast: Secondary | ICD-10-CM

## 2019-03-07 ENCOUNTER — Other Ambulatory Visit: Payer: Self-pay | Admitting: Family Medicine

## 2019-03-07 DIAGNOSIS — Z1231 Encounter for screening mammogram for malignant neoplasm of breast: Secondary | ICD-10-CM

## 2019-04-20 ENCOUNTER — Ambulatory Visit
Admission: RE | Admit: 2019-04-20 | Discharge: 2019-04-20 | Disposition: A | Discharge status: Home / Self Care | Payer: Medicare Other | Source: Ambulatory Visit | Attending: Family Medicine | Admitting: Family Medicine

## 2019-04-20 ENCOUNTER — Other Ambulatory Visit: Payer: Self-pay

## 2019-04-20 DIAGNOSIS — Z1231 Encounter for screening mammogram for malignant neoplasm of breast: Secondary | ICD-10-CM

## 2020-06-03 ENCOUNTER — Other Ambulatory Visit: Payer: Self-pay | Admitting: Family Medicine

## 2020-06-03 DIAGNOSIS — Z1231 Encounter for screening mammogram for malignant neoplasm of breast: Secondary | ICD-10-CM

## 2020-07-09 ENCOUNTER — Ambulatory Visit: Payer: Medicare Other

## 2020-07-24 ENCOUNTER — Other Ambulatory Visit: Payer: Self-pay

## 2020-07-24 ENCOUNTER — Ambulatory Visit
Admission: RE | Admit: 2020-07-24 | Discharge: 2020-07-24 | Disposition: A | Discharge status: Home / Self Care | Payer: Medicare Other | Source: Ambulatory Visit | Attending: Family Medicine | Admitting: Family Medicine

## 2020-07-24 DIAGNOSIS — Z1231 Encounter for screening mammogram for malignant neoplasm of breast: Secondary | ICD-10-CM

## 2020-09-15 DIAGNOSIS — E559 Vitamin D deficiency, unspecified: Secondary | ICD-10-CM | POA: Diagnosis not present

## 2020-09-15 DIAGNOSIS — E7800 Pure hypercholesterolemia, unspecified: Secondary | ICD-10-CM | POA: Diagnosis not present

## 2020-09-15 DIAGNOSIS — J302 Other seasonal allergic rhinitis: Secondary | ICD-10-CM | POA: Diagnosis not present

## 2020-09-15 DIAGNOSIS — E039 Hypothyroidism, unspecified: Secondary | ICD-10-CM | POA: Diagnosis not present

## 2020-09-16 DIAGNOSIS — C4441 Basal cell carcinoma of skin of scalp and neck: Secondary | ICD-10-CM | POA: Diagnosis not present

## 2021-01-23 DIAGNOSIS — Z0000 Encounter for general adult medical examination without abnormal findings: Secondary | ICD-10-CM | POA: Diagnosis not present

## 2021-01-23 DIAGNOSIS — E7800 Pure hypercholesterolemia, unspecified: Secondary | ICD-10-CM | POA: Diagnosis not present

## 2021-01-23 DIAGNOSIS — E039 Hypothyroidism, unspecified: Secondary | ICD-10-CM | POA: Diagnosis not present

## 2021-01-23 DIAGNOSIS — E559 Vitamin D deficiency, unspecified: Secondary | ICD-10-CM | POA: Diagnosis not present

## 2021-01-23 DIAGNOSIS — Z1159 Encounter for screening for other viral diseases: Secondary | ICD-10-CM | POA: Diagnosis not present

## 2021-01-29 DIAGNOSIS — E559 Vitamin D deficiency, unspecified: Secondary | ICD-10-CM | POA: Diagnosis not present

## 2021-01-29 DIAGNOSIS — E7800 Pure hypercholesterolemia, unspecified: Secondary | ICD-10-CM | POA: Diagnosis not present

## 2021-01-29 DIAGNOSIS — E039 Hypothyroidism, unspecified: Secondary | ICD-10-CM | POA: Diagnosis not present

## 2021-03-03 DIAGNOSIS — L814 Other melanin hyperpigmentation: Secondary | ICD-10-CM | POA: Diagnosis not present

## 2021-03-03 DIAGNOSIS — D2271 Melanocytic nevi of right lower limb, including hip: Secondary | ICD-10-CM | POA: Diagnosis not present

## 2021-03-03 DIAGNOSIS — D225 Melanocytic nevi of trunk: Secondary | ICD-10-CM | POA: Diagnosis not present

## 2021-03-03 DIAGNOSIS — D2262 Melanocytic nevi of left upper limb, including shoulder: Secondary | ICD-10-CM | POA: Diagnosis not present

## 2021-03-03 DIAGNOSIS — C4441 Basal cell carcinoma of skin of scalp and neck: Secondary | ICD-10-CM | POA: Diagnosis not present

## 2021-03-03 DIAGNOSIS — D2272 Melanocytic nevi of left lower limb, including hip: Secondary | ICD-10-CM | POA: Diagnosis not present

## 2021-03-03 DIAGNOSIS — D485 Neoplasm of uncertain behavior of skin: Secondary | ICD-10-CM | POA: Diagnosis not present

## 2021-03-03 DIAGNOSIS — L821 Other seborrheic keratosis: Secondary | ICD-10-CM | POA: Diagnosis not present

## 2021-03-03 DIAGNOSIS — Z08 Encounter for follow-up examination after completed treatment for malignant neoplasm: Secondary | ICD-10-CM | POA: Diagnosis not present

## 2021-03-03 DIAGNOSIS — D2261 Melanocytic nevi of right upper limb, including shoulder: Secondary | ICD-10-CM | POA: Diagnosis not present

## 2021-03-03 DIAGNOSIS — Z85828 Personal history of other malignant neoplasm of skin: Secondary | ICD-10-CM | POA: Diagnosis not present

## 2021-04-20 DIAGNOSIS — C4441 Basal cell carcinoma of skin of scalp and neck: Secondary | ICD-10-CM | POA: Diagnosis not present

## 2021-05-04 DIAGNOSIS — C4441 Basal cell carcinoma of skin of scalp and neck: Secondary | ICD-10-CM | POA: Diagnosis not present

## 2021-07-09 ENCOUNTER — Other Ambulatory Visit: Payer: Self-pay | Admitting: Family Medicine

## 2021-07-09 DIAGNOSIS — Z1231 Encounter for screening mammogram for malignant neoplasm of breast: Secondary | ICD-10-CM

## 2021-08-10 DIAGNOSIS — C4441 Basal cell carcinoma of skin of scalp and neck: Secondary | ICD-10-CM | POA: Diagnosis not present

## 2021-08-17 DIAGNOSIS — U071 COVID-19: Secondary | ICD-10-CM | POA: Diagnosis not present

## 2021-08-25 ENCOUNTER — Ambulatory Visit
Admission: RE | Admit: 2021-08-25 | Discharge: 2021-08-25 | Disposition: A | Discharge status: Home / Self Care | Payer: Medicare Other | Source: Ambulatory Visit | Attending: Family Medicine | Admitting: Family Medicine

## 2021-08-25 DIAGNOSIS — Z1231 Encounter for screening mammogram for malignant neoplasm of breast: Secondary | ICD-10-CM | POA: Diagnosis not present

## 2021-08-26 DIAGNOSIS — H52203 Unspecified astigmatism, bilateral: Secondary | ICD-10-CM | POA: Diagnosis not present

## 2021-08-26 DIAGNOSIS — H43813 Vitreous degeneration, bilateral: Secondary | ICD-10-CM | POA: Diagnosis not present

## 2021-08-26 DIAGNOSIS — H26493 Other secondary cataract, bilateral: Secondary | ICD-10-CM | POA: Diagnosis not present

## 2021-08-26 DIAGNOSIS — H04123 Dry eye syndrome of bilateral lacrimal glands: Secondary | ICD-10-CM | POA: Diagnosis not present

## 2021-09-01 DIAGNOSIS — H26491 Other secondary cataract, right eye: Secondary | ICD-10-CM | POA: Diagnosis not present

## 2021-09-28 DIAGNOSIS — Z08 Encounter for follow-up examination after completed treatment for malignant neoplasm: Secondary | ICD-10-CM | POA: Diagnosis not present

## 2021-09-28 DIAGNOSIS — D2271 Melanocytic nevi of right lower limb, including hip: Secondary | ICD-10-CM | POA: Diagnosis not present

## 2021-09-28 DIAGNOSIS — D2272 Melanocytic nevi of left lower limb, including hip: Secondary | ICD-10-CM | POA: Diagnosis not present

## 2021-09-28 DIAGNOSIS — D2262 Melanocytic nevi of left upper limb, including shoulder: Secondary | ICD-10-CM | POA: Diagnosis not present

## 2021-09-28 DIAGNOSIS — Z85828 Personal history of other malignant neoplasm of skin: Secondary | ICD-10-CM | POA: Diagnosis not present

## 2021-09-28 DIAGNOSIS — D2261 Melanocytic nevi of right upper limb, including shoulder: Secondary | ICD-10-CM | POA: Diagnosis not present

## 2021-09-28 DIAGNOSIS — L570 Actinic keratosis: Secondary | ICD-10-CM | POA: Diagnosis not present

## 2021-09-28 DIAGNOSIS — L578 Other skin changes due to chronic exposure to nonionizing radiation: Secondary | ICD-10-CM | POA: Diagnosis not present

## 2021-09-28 DIAGNOSIS — D225 Melanocytic nevi of trunk: Secondary | ICD-10-CM | POA: Diagnosis not present

## 2022-02-02 DIAGNOSIS — E7800 Pure hypercholesterolemia, unspecified: Secondary | ICD-10-CM | POA: Diagnosis not present

## 2022-02-02 DIAGNOSIS — Z79899 Other long term (current) drug therapy: Secondary | ICD-10-CM | POA: Diagnosis not present

## 2022-02-02 DIAGNOSIS — Z23 Encounter for immunization: Secondary | ICD-10-CM | POA: Diagnosis not present

## 2022-02-02 DIAGNOSIS — E039 Hypothyroidism, unspecified: Secondary | ICD-10-CM | POA: Diagnosis not present

## 2022-02-09 DIAGNOSIS — Z0000 Encounter for general adult medical examination without abnormal findings: Secondary | ICD-10-CM | POA: Diagnosis not present

## 2022-03-01 DIAGNOSIS — R918 Other nonspecific abnormal finding of lung field: Secondary | ICD-10-CM | POA: Diagnosis not present

## 2022-03-01 DIAGNOSIS — Z20822 Contact with and (suspected) exposure to covid-19: Secondary | ICD-10-CM | POA: Diagnosis not present

## 2022-03-01 DIAGNOSIS — R059 Cough, unspecified: Secondary | ICD-10-CM | POA: Diagnosis not present

## 2022-04-16 IMAGING — MG MM DIGITAL SCREENING BILAT W/ TOMO AND CAD
8 series · 8 of 24 positions shown · non-contrast
Comparison: Previous exam(s).

CLINICAL DATA: Screening.

EXAM:
DIGITAL SCREENING BILATERAL MAMMOGRAM WITH TOMOSYNTHESIS AND CAD
TECHNIQUE: Bilateral screening digital craniocaudal and mediolateral oblique
mammograms were obtained. Bilateral screening digital breast
tomosynthesis was performed. The images were evaluated with
computer-aided detection.

[R CC synth-2D]
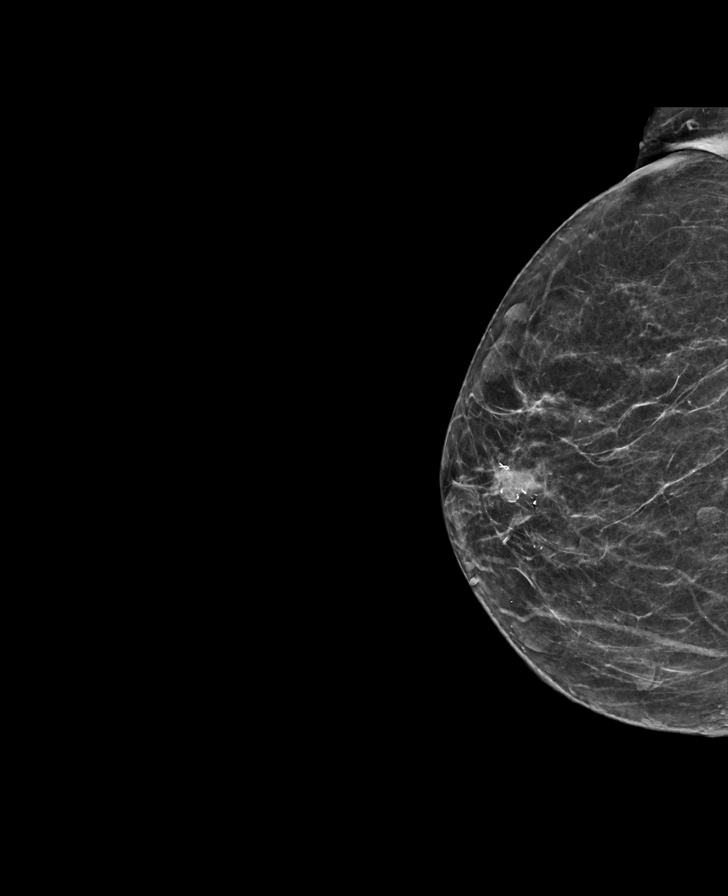

[L CC synth-2D]
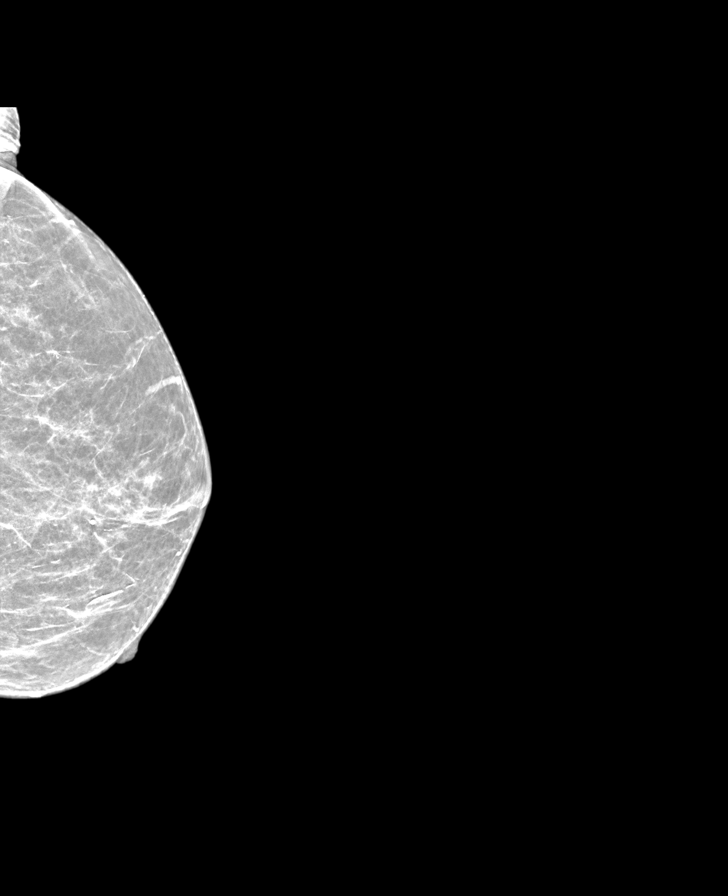

[L MLO synth-2D]
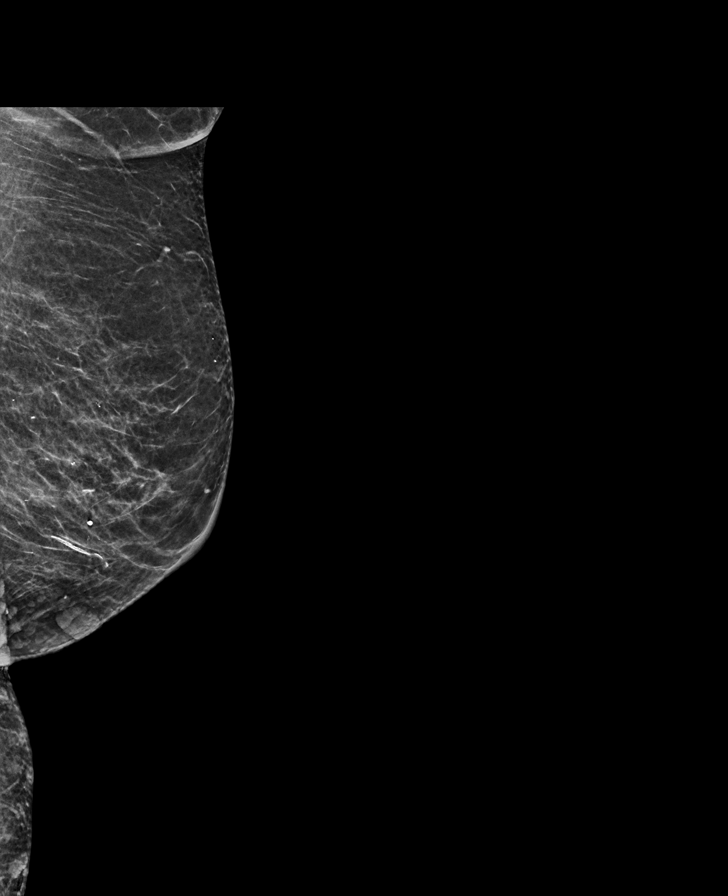

[R MLO synth-2D]
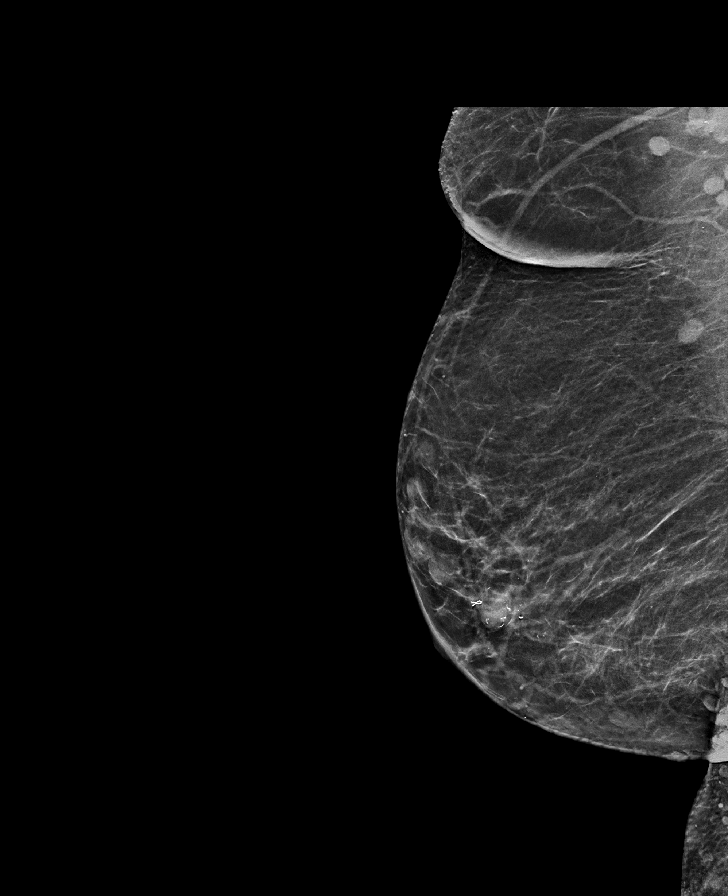

[R MLO tomo · tomo slice 32/63.0]
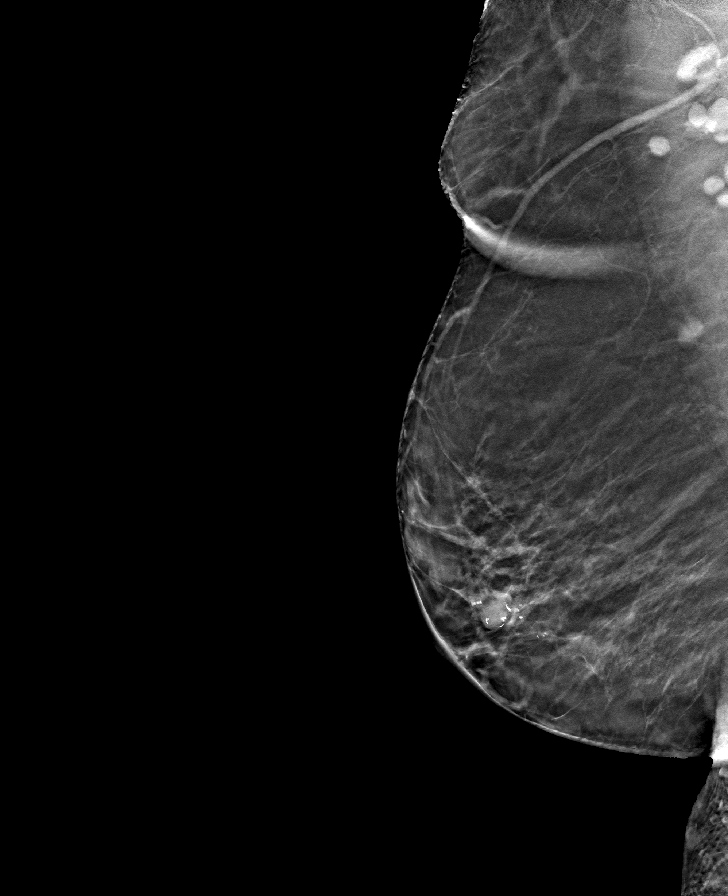

[L CC tomo · tomo slice 7/13.0]
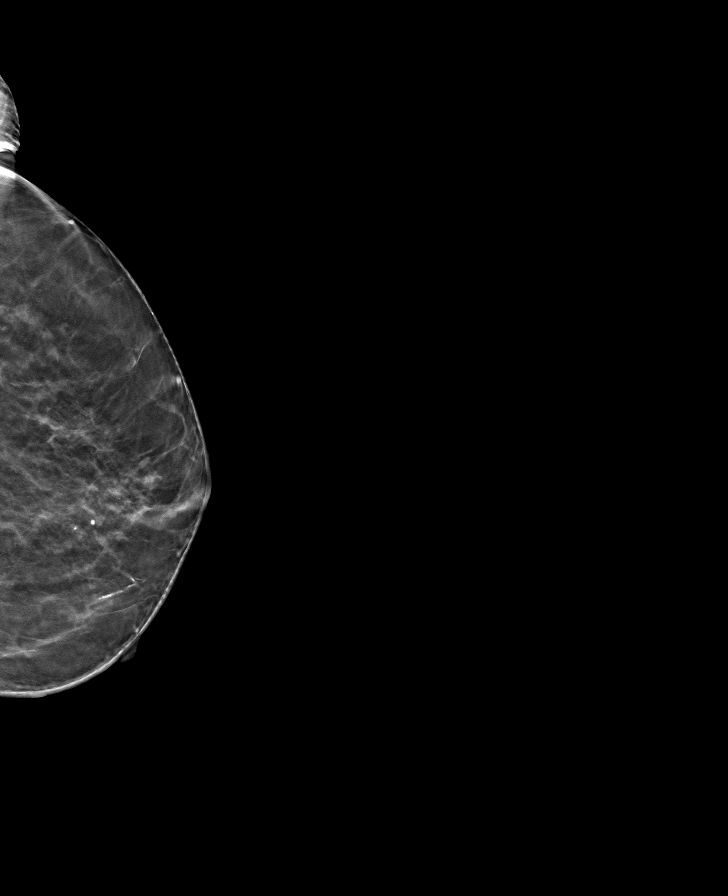

[R CC tomo · tomo slice 31/62.0]
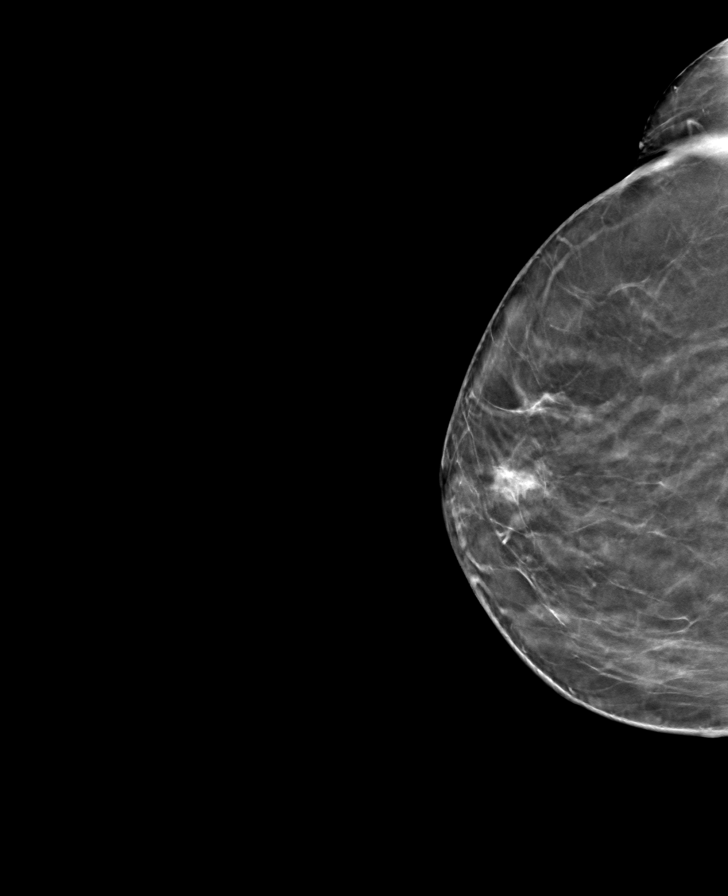

[L MLO tomo · tomo slice 25/49.0]
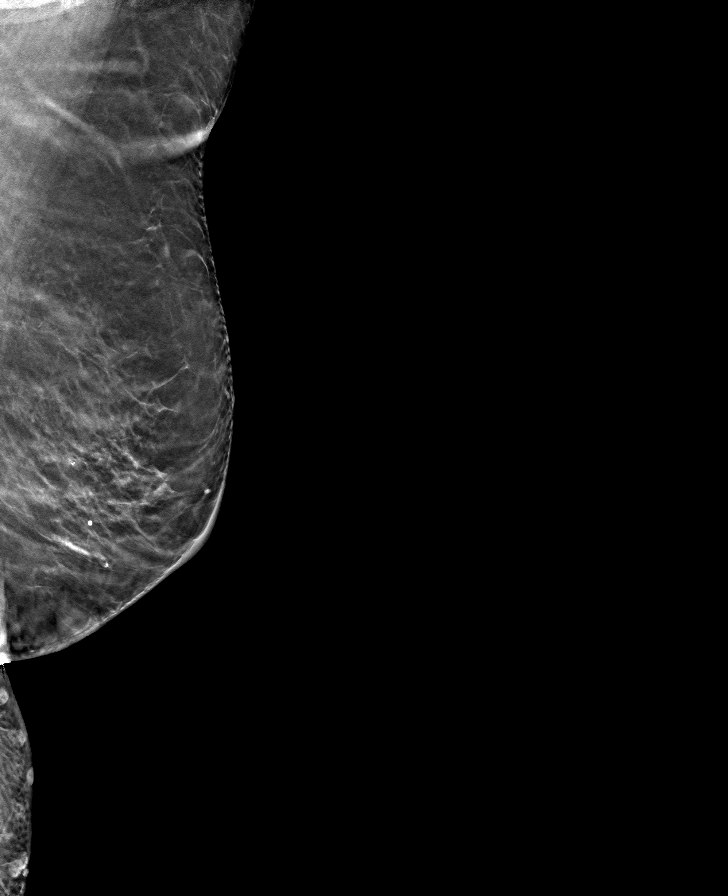

[8 of 24 positions shown; findings below may reference images not displayed]

ACR Breast Density Category b: There are scattered areas of
fibroglandular density.
FINDINGS: There are no findings suspicious for malignancy.
IMPRESSION: No mammographic evidence of malignancy. A result letter of this
screening mammogram will be mailed directly to the patient.

RECOMMENDATION:
Screening mammogram in one year. (Code:51-O-LD2)

BI-RADS CATEGORY  1: Negative.

## 2022-08-24 ENCOUNTER — Other Ambulatory Visit: Payer: Self-pay | Admitting: Family Medicine

## 2022-08-24 DIAGNOSIS — Z1231 Encounter for screening mammogram for malignant neoplasm of breast: Secondary | ICD-10-CM

## 2022-09-20 ENCOUNTER — Ambulatory Visit
Admission: RE | Admit: 2022-09-20 | Discharge: 2022-09-20 | Disposition: A | Discharge status: Home / Self Care | Payer: Medicare Other | Source: Ambulatory Visit | Attending: Family Medicine | Admitting: Family Medicine

## 2022-09-20 DIAGNOSIS — Z1231 Encounter for screening mammogram for malignant neoplasm of breast: Secondary | ICD-10-CM | POA: Diagnosis not present

## 2022-09-21 DIAGNOSIS — H52203 Unspecified astigmatism, bilateral: Secondary | ICD-10-CM | POA: Diagnosis not present

## 2022-09-21 DIAGNOSIS — H11823 Conjunctivochalasis, bilateral: Secondary | ICD-10-CM | POA: Diagnosis not present

## 2022-09-21 DIAGNOSIS — H26492 Other secondary cataract, left eye: Secondary | ICD-10-CM | POA: Diagnosis not present

## 2022-09-21 DIAGNOSIS — H524 Presbyopia: Secondary | ICD-10-CM | POA: Diagnosis not present

## 2022-09-21 DIAGNOSIS — H0100A Unspecified blepharitis right eye, upper and lower eyelids: Secondary | ICD-10-CM | POA: Diagnosis not present

## 2022-09-21 DIAGNOSIS — H43813 Vitreous degeneration, bilateral: Secondary | ICD-10-CM | POA: Diagnosis not present

## 2022-09-22 ENCOUNTER — Other Ambulatory Visit: Payer: Self-pay | Admitting: Family Medicine

## 2022-09-22 DIAGNOSIS — R928 Other abnormal and inconclusive findings on diagnostic imaging of breast: Secondary | ICD-10-CM

## 2022-09-29 ENCOUNTER — Ambulatory Visit
Admission: RE | Admit: 2022-09-29 | Discharge: 2022-09-29 | Disposition: A | Discharge status: Home / Self Care | Payer: Medicare Other | Source: Ambulatory Visit | Attending: Family Medicine | Admitting: Family Medicine

## 2022-09-29 ENCOUNTER — Other Ambulatory Visit: Payer: Self-pay | Admitting: Family Medicine

## 2022-09-29 DIAGNOSIS — N6310 Unspecified lump in the right breast, unspecified quadrant: Secondary | ICD-10-CM

## 2022-09-29 DIAGNOSIS — R928 Other abnormal and inconclusive findings on diagnostic imaging of breast: Secondary | ICD-10-CM | POA: Diagnosis not present

## 2022-09-29 DIAGNOSIS — N6489 Other specified disorders of breast: Secondary | ICD-10-CM | POA: Diagnosis not present

## 2022-10-05 ENCOUNTER — Ambulatory Visit
Admission: RE | Admit: 2022-10-05 | Discharge: 2022-10-05 | Disposition: A | Discharge status: Home / Self Care | Payer: Medicare Other | Source: Ambulatory Visit | Attending: Family Medicine | Admitting: Family Medicine

## 2022-10-05 DIAGNOSIS — N6310 Unspecified lump in the right breast, unspecified quadrant: Secondary | ICD-10-CM

## 2022-10-05 DIAGNOSIS — N6011 Diffuse cystic mastopathy of right breast: Secondary | ICD-10-CM | POA: Diagnosis not present

## 2022-10-05 DIAGNOSIS — N6311 Unspecified lump in the right breast, upper outer quadrant: Secondary | ICD-10-CM | POA: Diagnosis not present

## 2022-10-05 HISTORY — PX: BREAST BIOPSY: SHX20

## 2022-10-11 ENCOUNTER — Ambulatory Visit: Payer: Self-pay | Admitting: Surgery

## 2022-10-11 DIAGNOSIS — N6489 Other specified disorders of breast: Secondary | ICD-10-CM | POA: Diagnosis not present

## 2022-10-11 NOTE — H&P (View-Only) (Signed)
Subjective   Chief Complaint: New Consultation (Right Complex sclerosing lesion)     History of Present Illness: Kara Pugh is a 82 y.o. female who is seen today as an office consultation at the request of Dr. Sabra Heck for evaluation of New Consultation (Right Complex sclerosing lesion) .   This is an 82 year old female with a history of left breast cancer at least 20 years ago.  She was treated with a left lumpectomy followed by radiation and tamoxifen.  Apparently she was a subject in the tamoxifen trials.  She had been doing well until about 5 years ago.  She had a right breast mass seen on mammogram and underwent a biopsy in 2018.  This revealed a hamartoma and was felt to be benign.  Recently, she underwent further imaging that revealed that this mass had become larger.  She now has a 1.4 x 0.9 x 1.2 cm mass in the retroareolar region at 12:00.  She underwent biopsy of this area which revealed a complex sclerosing lesion with no sign of malignancy.  She did have some bruising after her biopsy.  Review of Systems: A complete review of systems was obtained from the patient.  I have reviewed this information and discussed as appropriate with the patient.  See HPI as well for other ROS.  Review of Systems  Constitutional: Negative.   HENT:  Positive for tinnitus.   Eyes: Negative.   Respiratory: Negative.    Cardiovascular: Negative.   Gastrointestinal: Negative.   Genitourinary: Negative.   Musculoskeletal: Negative.   Skin: Negative.   Neurological: Negative.   Endo/Heme/Allergies: Negative.   Psychiatric/Behavioral: Negative.        Medical History: Past Medical History:  Diagnosis Date   Asthma, unspecified asthma severity, unspecified whether complicated, unspecified whether persistent    Thyroid disease     Patient Active Problem List  Diagnosis   Complex sclerosing lesion of right breast    Past Surgical History:  Procedure Laterality Date   HYSTERECTOMY        Allergies  Allergen Reactions   Codeine Hives    rash   Latex Dermatitis   Sulfa (Sulfonamide Antibiotics) Diarrhea    Current Outpatient Medications on File Prior to Visit  Medication Sig Dispense Refill   levothyroxine (SYNTHROID) 112 MCG tablet TAKE 1 TABLET BY MOUTH EVERY MORNING ON AN EMPTY STOMACH ONCE A DAY 90 DAYS     simvastatin (ZOCOR) 20 MG tablet Take 20 mg by mouth once daily     No current facility-administered medications on file prior to visit.    Family History  Problem Relation Age of Onset   Breast cancer Mother      Social History   Tobacco Use  Smoking Status Never  Smokeless Tobacco Never     Social History   Socioeconomic History   Marital status: Married  Tobacco Use   Smoking status: Never   Smokeless tobacco: Never  Substance and Sexual Activity   Alcohol use: Yes   Drug use: Never    Objective:    Vitals:   10/11/22 1027  BP: (!) 156/93  Pulse: 58  Temp: 36.7 C (98 F)  SpO2: (!) 89%  Weight: 73.9 kg (163 lb)  Height: 157.5 cm ('5\' 2"'$ )    Body mass index is 29.81 kg/m.  Physical Exam   Constitutional:  WDWN in NAD, conversant, no obvious deformities; lying in bed comfortably Eyes:  Pupils equal, round; sclera anicteric; moist conjunctiva; no lid lag HENT:  Oral mucosa moist; good dentition  Neck:  No masses palpated, trachea midline; no thyromegaly Lungs:  CTA bilaterally; normal respiratory effort Breasts:  symmetric, no nipple changes; no palpable masses or lymphadenopathy on either side; slight ecchymosis and firmness in the right retroareolar region CV:  Regular rate and rhythm; no murmurs; extremities well-perfused with no edema Abd:  +bowel sounds, soft, non-tender, no palpable organomegaly; no palpable hernias Musc:  Unable to assess gait; no apparent clubbing or cyanosis in extremities Lymphatic:  No palpable cervical or axillary lymphadenopathy Skin:  Warm, dry; no sign of jaundice Psychiatric - alert and  oriented x 4; calm mood and affect   Labs, Imaging and Diagnostic Testing:  Diagnosis Breast, right, needle core biopsy, 12 o'clock, retro - COMPLEX SCLEROSING LESION WITH FIBROCYSTIC CHANGE, USUAL DUCTAL HYPERPLASIA AND PAPILLARY APOCRINE METAPLASIA WITH ASSOCIATED CALCIFICATIONS. - IMMUNOHISTOCHEMICAL STAINS FOR CALPONIN, SMOOTH MUSCLE MYOSIN AND P63 SHOW RETAINED MYOEPITHELIAL CELLS, AND E-CADHERIN SHOWS RETAINED MEMBRANOUS STAINING. Microscopic Comment [facility] was notified of the diagnosis on 10/07/2022. Tilford Pillar DO Pathologist, Electronic Signature (Case signed 10/07/2022)  CLINICAL DATA:  The patient was called back from screening mammography due to a possibly enlarging mass in the right breast which was previously biopsied in 2018 demonstrating a hamartomatous like lesion with calcifications.   EXAM: DIGITAL DIAGNOSTIC UNILATERAL RIGHT MAMMOGRAM WITH TOMOSYNTHESIS   TECHNIQUE: Right digital diagnostic mammography and breast tomosynthesis was performed.   COMPARISON:  Previous exam(s).   ACR Breast Density Category b: There are scattered areas of fibroglandular density.   FINDINGS: The mass in the right breast appears larger compared to 2018.   Targeted ultrasound is performed, showing a mass at 12 o'clock in the retroareolar region measuring 14 x 9 x 12 mm. It is difficult to compare today's ultrasound of the previous due to the shape of the mass. No axillary adenopathy.   IMPRESSION: Possible growing mass in the right retroareolar region.   RECOMMENDATION: Recommend ultrasound-guided biopsy of the possible growing mass.   I have discussed the findings and recommendations with the patient. If applicable, a reminder letter will be sent to the patient regarding the next appointment.   BI-RADS CATEGORY  4: Suspicious.   Electronically Signed: By: Dorise Bullion III M.D. On: 09/29/2022 13:47    Assessment and Plan:  Diagnoses  and all orders for this visit:  Complex sclerosing lesion of right breast  We discussed the pathology results and the significant of the finding of complex sclerosing lesion.  Considering her history of breast cancer in the contralateral breast, we would recommend excision of this mass to confirm that it was only a complex sclerosing lesion. Recommend right breast radioactive seed localized excisional biopsy.  The surgical procedure has been discussed with the patient.  Potential risks, benefits, alternative treatments, and expected outcomes have been explained.  All of the patient's questions at this time have been answered.  The likelihood of reaching the patient's treatment goal is good.  The patient understand the proposed surgical procedure and wishes to proceed.     Carlean Jews, MD  10/11/2022 2:56 PM

## 2022-10-11 NOTE — H&P (Signed)
 Subjective   Chief Complaint: New Consultation (Right Complex sclerosing lesion)     History of Present Illness: Kara Pugh is a 81 y.o. female who is seen today as an office consultation at the request of Dr. Miller for evaluation of New Consultation (Right Complex sclerosing lesion) .   This is an 81-year-old female with a history of left breast cancer at least 20 years ago.  She was treated with a left lumpectomy followed by radiation and tamoxifen.  Apparently she was a subject in the tamoxifen trials.  She had been doing well until about 5 years ago.  She had a right breast mass seen on mammogram and underwent a biopsy in 2018.  This revealed a hamartoma and was felt to be benign.  Recently, she underwent further imaging that revealed that this mass had become larger.  She now has a 1.4 x 0.9 x 1.2 cm mass in the retroareolar region at 12:00.  She underwent biopsy of this area which revealed a complex sclerosing lesion with no sign of malignancy.  She did have some bruising after her biopsy.  Review of Systems: A complete review of systems was obtained from the patient.  I have reviewed this information and discussed as appropriate with the patient.  See HPI as well for other ROS.  Review of Systems  Constitutional: Negative.   HENT:  Positive for tinnitus.   Eyes: Negative.   Respiratory: Negative.    Cardiovascular: Negative.   Gastrointestinal: Negative.   Genitourinary: Negative.   Musculoskeletal: Negative.   Skin: Negative.   Neurological: Negative.   Endo/Heme/Allergies: Negative.   Psychiatric/Behavioral: Negative.        Medical History: Past Medical History:  Diagnosis Date   Asthma, unspecified asthma severity, unspecified whether complicated, unspecified whether persistent    Thyroid disease     Patient Active Problem List  Diagnosis   Complex sclerosing lesion of right breast    Past Surgical History:  Procedure Laterality Date   HYSTERECTOMY        Allergies  Allergen Reactions   Codeine Hives    rash   Latex Dermatitis   Sulfa (Sulfonamide Antibiotics) Diarrhea    Current Outpatient Medications on File Prior to Visit  Medication Sig Dispense Refill   levothyroxine (SYNTHROID) 112 MCG tablet TAKE 1 TABLET BY MOUTH EVERY MORNING ON AN EMPTY STOMACH ONCE A DAY 90 DAYS     simvastatin (ZOCOR) 20 MG tablet Take 20 mg by mouth once daily     No current facility-administered medications on file prior to visit.    Family History  Problem Relation Age of Onset   Breast cancer Mother      Social History   Tobacco Use  Smoking Status Never  Smokeless Tobacco Never     Social History   Socioeconomic History   Marital status: Married  Tobacco Use   Smoking status: Never   Smokeless tobacco: Never  Substance and Sexual Activity   Alcohol use: Yes   Drug use: Never    Objective:    Vitals:   10/11/22 1027  BP: (!) 156/93  Pulse: 58  Temp: 36.7 C (98 F)  SpO2: (!) 89%  Weight: 73.9 kg (163 lb)  Height: 157.5 cm (5' 2")    Body mass index is 29.81 kg/m.  Physical Exam   Constitutional:  WDWN in NAD, conversant, no obvious deformities; lying in bed comfortably Eyes:  Pupils equal, round; sclera anicteric; moist conjunctiva; no lid lag HENT:    Oral mucosa moist; good dentition  Neck:  No masses palpated, trachea midline; no thyromegaly Lungs:  CTA bilaterally; normal respiratory effort Breasts:  symmetric, no nipple changes; no palpable masses or lymphadenopathy on either side; slight ecchymosis and firmness in the right retroareolar region CV:  Regular rate and rhythm; no murmurs; extremities well-perfused with no edema Abd:  +bowel sounds, soft, non-tender, no palpable organomegaly; no palpable hernias Musc:  Unable to assess gait; no apparent clubbing or cyanosis in extremities Lymphatic:  No palpable cervical or axillary lymphadenopathy Skin:  Warm, dry; no sign of jaundice Psychiatric - alert and  oriented x 4; calm mood and affect   Labs, Imaging and Diagnostic Testing:  Diagnosis Breast, right, needle core biopsy, 12 o'clock, retro - COMPLEX SCLEROSING LESION WITH FIBROCYSTIC CHANGE, USUAL DUCTAL HYPERPLASIA AND PAPILLARY APOCRINE METAPLASIA WITH ASSOCIATED CALCIFICATIONS. - IMMUNOHISTOCHEMICAL STAINS FOR CALPONIN, SMOOTH MUSCLE MYOSIN AND P63 SHOW RETAINED MYOEPITHELIAL CELLS, AND E-CADHERIN SHOWS RETAINED MEMBRANOUS STAINING. Microscopic Comment [facility] was notified of the diagnosis on 10/07/2022. Mark LeGolvan DO Pathologist, Electronic Signature (Case signed 10/07/2022)  CLINICAL DATA:  The patient was called back from screening mammography due to a possibly enlarging mass in the right breast which was previously biopsied in 2018 demonstrating a hamartomatous like lesion with calcifications.   EXAM: DIGITAL DIAGNOSTIC UNILATERAL RIGHT MAMMOGRAM WITH TOMOSYNTHESIS   TECHNIQUE: Right digital diagnostic mammography and breast tomosynthesis was performed.   COMPARISON:  Previous exam(s).   ACR Breast Density Category b: There are scattered areas of fibroglandular density.   FINDINGS: The mass in the right breast appears larger compared to 2018.   Targeted ultrasound is performed, showing a mass at 12 o'clock in the retroareolar region measuring 14 x 9 x 12 mm. It is difficult to compare today's ultrasound of the previous due to the shape of the mass. No axillary adenopathy.   IMPRESSION: Possible growing mass in the right retroareolar region.   RECOMMENDATION: Recommend ultrasound-guided biopsy of the possible growing mass.   I have discussed the findings and recommendations with the patient. If applicable, a reminder letter will be sent to the patient regarding the next appointment.   BI-RADS CATEGORY  4: Suspicious.   Electronically Signed: By: David  Williams III M.D. On: 09/29/2022 13:47    Assessment and Plan:  Diagnoses  and all orders for this visit:  Complex sclerosing lesion of right breast  We discussed the pathology results and the significant of the finding of complex sclerosing lesion.  Considering her history of breast cancer in the contralateral breast, we would recommend excision of this mass to confirm that it was only a complex sclerosing lesion. Recommend right breast radioactive seed localized excisional biopsy.  The surgical procedure has been discussed with the patient.  Potential risks, benefits, alternative treatments, and expected outcomes have been explained.  All of the patient's questions at this time have been answered.  The likelihood of reaching the patient's treatment goal is good.  The patient understand the proposed surgical procedure and wishes to proceed.     Taeveon Keesling KAI Yaman Grauberger, MD  10/11/2022 2:56 PM 

## 2022-10-13 ENCOUNTER — Other Ambulatory Visit: Payer: Self-pay | Admitting: Surgery

## 2022-10-13 DIAGNOSIS — N6489 Other specified disorders of breast: Secondary | ICD-10-CM

## 2022-11-04 NOTE — Pre-Procedure Instructions (Signed)
Surgical Instructions    Your procedure is scheduled on November 10, 2022.  Report to [facility] Main Entrance "A" at 11:00 A.M., then check in with the Admitting office.  Call this number if you have problems the morning of surgery:  [phone]  If you have any questions prior to your surgery date call [phone]: Open Monday-Friday 8am-4pm If you experience any cold or flu symptoms such as cough, fever, chills, shortness of breath, etc. between now and your scheduled surgery, please notify us at the above number.     Remember:  Do not eat after midnight the night before your surgery  You may drink clear liquids until 10:00 AM the morning of your surgery.   Clear liquids allowed are: Water, Non-Citrus Juices (without pulp), Carbonated Beverages, Clear Tea, Black Coffee Only (NO MILK, CREAM OR POWDERED CREAMER of any kind), and Gatorade.  Patient Instructions  The night before surgery:  No food after midnight. ONLY clear liquids after midnight  The day of surgery (if you do NOT have diabetes):  Drink ONE (1) Pre-Surgery Clear Ensure by 10:00 AM the morning of surgery. Drink in one sitting. Do not sip.  This drink was given to you during your hospital  pre-op appointment visit.  Nothing else to drink after completing the  Pre-Surgery Clear Ensure.         If you have questions, please contact your surgeon's office.      Take these medicines the morning of surgery with A SIP OF WATER:  levothyroxine (SYNTHROID)   loratadine (CLARITIN)   simvastatin (ZOCOR)     As of today, STOP taking any Aspirin (unless otherwise instructed by your surgeon) Aleve, Naproxen, Ibuprofen, Motrin, Advil, Goody's, BC's, all herbal medications, fish oil, and all vitamins.                     Do NOT Smoke (Tobacco/Vaping) for 24 hours prior to your procedure.  If you use a CPAP at night, you may bring your mask/headgear for your overnight stay.   Contacts, glasses, piercing's, hearing  aid's, dentures or partials may not be worn into surgery, please bring cases for these belongings.    For patients admitted to the hospital, discharge time will be determined by your treatment team.   Patients discharged the day of surgery will not be allowed to drive home, and someone needs to stay with them for 24 hours.  SURGICAL WAITING ROOM VISITATION Patients having surgery or a procedure may have no more than 2 support people in the waiting area - these visitors may rotate.   Children under the age of 41 must have an adult with them who is not the patient. If the patient needs to stay at the hospital during part of their recovery, the visitor guidelines for inpatient rooms apply. Pre-op nurse will coordinate an appropriate time for 1 support person to accompany patient in pre-op.  This support person may not rotate.   Please refer to the [facility] website for the visitor guidelines for Inpatients (after your surgery is over and you are in a regular room).    Special instructions:   [facility]- Preparing For Surgery  Before surgery, you can play an important role. Because skin is not sterile, your skin needs to be as free of germs as possible. You can reduce the number of germs on your skin by washing with CHG (chlorahexidine gluconate) Soap before surgery.  CHG is an antiseptic cleaner which kills germs and  bonds with the skin to continue killing germs even after washing.    Oral Hygiene is also important to reduce your risk of infection.  Remember - BRUSH YOUR TEETH THE MORNING OF SURGERY WITH YOUR REGULAR TOOTHPASTE  Please do not use if you have an allergy to CHG or antibacterial soaps. If your skin becomes reddened/irritated stop using the CHG.  Do not shave (including legs and underarms) for at least 48 hours prior to first CHG shower. It is OK to shave your face.  Please follow these instructions carefully.   Shower the NIGHT BEFORE SURGERY and the MORNING OF SURGERY  If  you chose to wash your hair, wash your hair first as usual with your normal shampoo.  After you shampoo, rinse your hair and body thoroughly to remove the shampoo.  Use CHG Soap as you would any other liquid soap. You can apply CHG directly to the skin and wash gently with a scrungie or a clean washcloth.   Apply the CHG Soap to your body ONLY FROM THE NECK DOWN.  Do not use on open wounds or open sores. Avoid contact with your eyes, ears, mouth and genitals (private parts). Wash Face and genitals (private parts)  with your normal soap.   Wash thoroughly, paying special attention to the area where your surgery will be performed.  Thoroughly rinse your body with warm water from the neck down.  DO NOT shower/wash with your normal soap after using and rinsing off the CHG Soap.  Pat yourself dry with a CLEAN TOWEL.  Wear CLEAN PAJAMAS to bed the night before surgery  Place CLEAN SHEETS on your bed the night before your surgery  DO NOT SLEEP WITH PETS.   Day of Surgery: Take a shower with CHG soap. Do not wear jewelry or makeup Do not wear lotions, powders, perfumes, or deodorant. Do not shave 48 hours prior to surgery.   Do not bring valuables to the hospital.  [facility] is not responsible for any belongings or valuables. Do not wear nail polish, gel polish, artificial nails, or any other type of covering on natural nails (fingers and toes) If you have artificial nails or gel coating that need to be removed by a nail salon, please have this removed prior to surgery. Artificial nails or gel coating may interfere with anesthesia's ability to adequately monitor your vital signs.  Wear Clean/Comfortable clothing the morning of surgery Remember to brush your teeth WITH YOUR REGULAR TOOTHPASTE.   Please read over the following fact sheets that you were given.    If you received a COVID test during your pre-op visit  it is requested that you wear a mask when out in public, stay away  from anyone that may not be feeling well and notify your surgeon if you develop symptoms. If you have been in contact with anyone that has tested positive in the last 10 days please notify you surgeon.

## 2022-11-05 ENCOUNTER — Encounter (HOSPITAL_COMMUNITY)
Admission: RE | Admit: 2022-11-05 | Discharge: 2022-11-05 | Disposition: A | Discharge status: Home / Self Care | Payer: Medicare Other | Source: Ambulatory Visit | Attending: Surgery | Admitting: Surgery

## 2022-11-05 ENCOUNTER — Other Ambulatory Visit: Payer: Self-pay

## 2022-11-05 ENCOUNTER — Encounter (HOSPITAL_COMMUNITY): Payer: Self-pay

## 2022-11-05 VITALS — BP 155/92 | HR 76 | Temp 97.6°F | Resp 17 | Ht 62.0 in | Wt 164.3 lb

## 2022-11-05 DIAGNOSIS — Z01812 Encounter for preprocedural laboratory examination: Secondary | ICD-10-CM | POA: Insufficient documentation

## 2022-11-05 DIAGNOSIS — K769 Liver disease, unspecified: Secondary | ICD-10-CM | POA: Diagnosis not present

## 2022-11-05 HISTORY — DX: Inflammatory liver disease, unspecified: K75.9

## 2022-11-05 HISTORY — DX: Unspecified osteoarthritis, unspecified site: M19.90

## 2022-11-05 HISTORY — DX: Hypothyroidism, unspecified: E03.9

## 2022-11-05 HISTORY — DX: Pneumonia, unspecified organism: J18.9

## 2022-11-05 HISTORY — DX: Anemia, unspecified: D64.9

## 2022-11-05 HISTORY — DX: Unspecified asthma, uncomplicated: J45.909

## 2022-11-05 LAB — COMPREHENSIVE METABOLIC PANEL
ALT: 30 U/L (ref 0–44)
AST: 40 U/L (ref 15–41)
Albumin: 4 g/dL (ref 3.5–5.0)
Alkaline Phosphatase: 65 U/L (ref 38–126)
Anion gap: 10 (ref 5–15)
BUN: 16 mg/dL (ref 8–23)
CO2: 26 mmol/L (ref 22–32)
Calcium: 9.6 mg/dL (ref 8.9–10.3)
Chloride: 97 mmol/L — ABNORMAL LOW (ref 98–111)
Creatinine, Ser: 0.79 mg/dL (ref 0.44–1.00)
GFR, Estimated: >60 mL/min (ref >60)
Glucose, Bld: 75 mg/dL (ref 70–99)
Potassium: 4.5 mmol/L (ref 3.5–5.1)
Sodium: 133 mmol/L — ABNORMAL LOW (ref 135–145)
Total Bilirubin: 1 mg/dL (ref 0.3–1.2)
Total Protein: 6.7 g/dL (ref 6.5–8.1)

## 2022-11-05 LAB — CBC
HCT: 43.1 % (ref 36.0–46.0)
Hemoglobin: 14.8 g/dL (ref 12.0–15.0)
MCH: 32.1 pg (ref 26.0–34.0)
MCHC: 34.3 g/dL (ref 30.0–36.0)
MCV: 93.5 fL (ref 80.0–100.0)
Platelets: 163 10*3/uL (ref 150–400)
RBC: 4.61 MIL/uL (ref 3.87–5.11)
RDW: 12.1 % (ref 11.5–15.5)
WBC: 6 10*3/uL (ref 4.0–10.5)
nRBC: 0 % (ref 0.0–0.2)

## 2022-11-05 NOTE — Progress Notes (Signed)
PCP - Dr. Kathyrn Lass Cardiologist - Denies  PPM/ICD - Denies Device Orders - n/a Rep Notified - n/a  Chest x-ray - n/a EKG - Denies Stress Test - Denies ECHO - Denies Cardiac Cath - Denies  Sleep Study - Denies CPAP - n/a  No DM  Last dose of GLP1 agonist- n/a GLP1 instructions: n/a  Blood Thinner Instructions: n/a Aspirin Instructions: n/a  ERAS Protcol - Clear liquids until 1000 morning of surgery PRE-SURGERY Ensure or G2- Ensure given to pt at pre-op appointment with instructions  COVID TEST- n/a   Anesthesia review: Yes. Breast seed placement.   Patient denies shortness of breath, fever, cough and chest pain at PAT appointment   All instructions explained to the patient, with a verbal understanding of the material. Patient agrees to go over the instructions while at home for a better understanding. Patient also instructed to self quarantine after being tested for COVID-19. The opportunity to ask questions was provided.

## 2022-11-09 ENCOUNTER — Ambulatory Visit
Admission: RE | Admit: 2022-11-09 | Discharge: 2022-11-09 | Disposition: A | Discharge status: Home / Self Care | Payer: Medicare Other | Source: Ambulatory Visit | Attending: Surgery | Admitting: Surgery

## 2022-11-09 DIAGNOSIS — Z853 Personal history of malignant neoplasm of breast: Secondary | ICD-10-CM | POA: Diagnosis not present

## 2022-11-09 DIAGNOSIS — R928 Other abnormal and inconclusive findings on diagnostic imaging of breast: Secondary | ICD-10-CM | POA: Diagnosis not present

## 2022-11-09 DIAGNOSIS — N6489 Other specified disorders of breast: Secondary | ICD-10-CM

## 2022-11-09 HISTORY — PX: BREAST BIOPSY: SHX20

## 2022-11-10 ENCOUNTER — Other Ambulatory Visit: Payer: Self-pay

## 2022-11-10 ENCOUNTER — Ambulatory Visit
Admission: RE | Admit: 2022-11-10 | Discharge: 2022-11-10 | Disposition: A | Discharge status: Home / Self Care | Payer: Medicare Other | Source: Ambulatory Visit | Attending: Surgery | Admitting: Surgery

## 2022-11-10 ENCOUNTER — Ambulatory Visit (HOSPITAL_COMMUNITY)
Admission: RE | Admit: 2022-11-10 | Discharge: 2022-11-10 | Disposition: A | Discharge status: Home / Self Care | Payer: Medicare Other | Source: Ambulatory Visit | Attending: Surgery | Admitting: Surgery

## 2022-11-10 ENCOUNTER — Encounter (HOSPITAL_COMMUNITY): Payer: Self-pay | Admitting: Surgery

## 2022-11-10 ENCOUNTER — Encounter (HOSPITAL_COMMUNITY)
Admission: RE | Disposition: A | Discharge status: Home / Self Care | Payer: Self-pay | Source: Ambulatory Visit | Attending: Surgery

## 2022-11-10 ENCOUNTER — Ambulatory Visit (HOSPITAL_BASED_OUTPATIENT_CLINIC_OR_DEPARTMENT_OTHER): Payer: Medicare Other | Admitting: Critical Care Medicine

## 2022-11-10 ENCOUNTER — Ambulatory Visit (HOSPITAL_COMMUNITY): Payer: Medicare Other | Admitting: Physician Assistant

## 2022-11-10 DIAGNOSIS — N6081 Other benign mammary dysplasias of right breast: Secondary | ICD-10-CM | POA: Insufficient documentation

## 2022-11-10 DIAGNOSIS — N6489 Other specified disorders of breast: Secondary | ICD-10-CM

## 2022-11-10 DIAGNOSIS — N6011 Diffuse cystic mastopathy of right breast: Secondary | ICD-10-CM | POA: Insufficient documentation

## 2022-11-10 DIAGNOSIS — N6021 Fibroadenosis of right breast: Secondary | ICD-10-CM | POA: Diagnosis not present

## 2022-11-10 DIAGNOSIS — Z853 Personal history of malignant neoplasm of breast: Secondary | ICD-10-CM | POA: Insufficient documentation

## 2022-11-10 DIAGNOSIS — D241 Benign neoplasm of right breast: Secondary | ICD-10-CM | POA: Diagnosis not present

## 2022-11-10 DIAGNOSIS — E039 Hypothyroidism, unspecified: Secondary | ICD-10-CM | POA: Diagnosis not present

## 2022-11-10 DIAGNOSIS — N6031 Fibrosclerosis of right breast: Secondary | ICD-10-CM | POA: Diagnosis not present

## 2022-11-10 DIAGNOSIS — R928 Other abnormal and inconclusive findings on diagnostic imaging of breast: Secondary | ICD-10-CM | POA: Diagnosis not present

## 2022-11-10 DIAGNOSIS — R920 Mammographic microcalcification found on diagnostic imaging of breast: Secondary | ICD-10-CM | POA: Diagnosis not present

## 2022-11-10 DIAGNOSIS — Z923 Personal history of irradiation: Secondary | ICD-10-CM | POA: Diagnosis not present

## 2022-11-10 DIAGNOSIS — N6001 Solitary cyst of right breast: Secondary | ICD-10-CM | POA: Diagnosis not present

## 2022-11-10 DIAGNOSIS — N62 Hypertrophy of breast: Secondary | ICD-10-CM | POA: Diagnosis not present

## 2022-11-10 HISTORY — PX: RADIOACTIVE SEED GUIDED EXCISIONAL BREAST BIOPSY: SHX6490

## 2022-11-10 SURGERY — RADIOACTIVE SEED GUIDED BREAST BIOPSY
Anesthesia: General | Site: Breast | Laterality: Right

## 2022-11-10 MED ORDER — 0.9 % SODIUM CHLORIDE (POUR BTL) OPTIME
TOPICAL | Status: DC | PRN
  Administered 2022-11-10: 1000 mL

## 2022-11-10 MED ORDER — CHLORHEXIDINE GLUCONATE 0.12 % MT SOLN
OROMUCOSAL | Status: AC
  Administered 2022-11-10: 15 mL via OROMUCOSAL
  Filled 2022-11-10: qty 15

## 2022-11-10 MED ORDER — CHLORHEXIDINE GLUCONATE 0.12 % MT SOLN: 15.0000 mL | Freq: Once | OROMUCOSAL | Status: AC

## 2022-11-10 MED ORDER — PROPOFOL 10 MG/ML IV BOLUS
INTRAVENOUS | Status: DC | PRN
  Administered 2022-11-10: 100 mg via INTRAVENOUS
  Administered 2022-11-10: 50 mg via INTRAVENOUS

## 2022-11-10 MED ORDER — ONDANSETRON HCL 4 MG/2ML IJ SOLN
INTRAMUSCULAR | Status: DC | PRN
  Administered 2022-11-10: 4 mg via INTRAVENOUS

## 2022-11-10 MED ORDER — OXYCODONE HCL 5 MG PO TABS: 5.0000 mg | ORAL_TABLET | Freq: Once | ORAL | Status: DC | PRN

## 2022-11-10 MED ORDER — FENTANYL CITRATE (PF) 250 MCG/5ML IJ SOLN
INTRAMUSCULAR | Status: AC
  Filled 2022-11-10: qty 5

## 2022-11-10 MED ORDER — DEXAMETHASONE SODIUM PHOSPHATE 10 MG/ML IJ SOLN
INTRAMUSCULAR | Status: AC
  Filled 2022-11-10: qty 2

## 2022-11-10 MED ORDER — LIDOCAINE 2% (20 MG/ML) 5 ML SYRINGE
INTRAMUSCULAR | Status: DC | PRN
  Administered 2022-11-10: 60 mg via INTRAVENOUS

## 2022-11-10 MED ORDER — OXYCODONE HCL 5 MG/5ML PO SOLN: 5.0000 mg | Freq: Once | ORAL | Status: DC | PRN

## 2022-11-10 MED ORDER — FENTANYL CITRATE (PF) 100 MCG/2ML IJ SOLN: 25.0000 ug | INTRAMUSCULAR | Status: DC | PRN

## 2022-11-10 MED ORDER — CEFAZOLIN SODIUM-DEXTROSE 2-4 GM/100ML-% IV SOLN
2.0000 g | INTRAVENOUS | Status: AC
  Administered 2022-11-10: 2 g via INTRAVENOUS

## 2022-11-10 MED ORDER — ONDANSETRON HCL 4 MG/2ML IJ SOLN: 4.0000 mg | Freq: Four times a day (QID) | INTRAMUSCULAR | Status: DC | PRN

## 2022-11-10 MED ORDER — ORAL CARE MOUTH RINSE: 15.0000 mL | Freq: Once | OROMUCOSAL | Status: AC

## 2022-11-10 MED ORDER — ACETAMINOPHEN 500 MG PO TABS
ORAL_TABLET | ORAL | Status: AC
  Administered 2022-11-10: 1000 mg via ORAL
  Filled 2022-11-10: qty 2

## 2022-11-10 MED ORDER — ONDANSETRON HCL 4 MG/2ML IJ SOLN
INTRAMUSCULAR | Status: AC
  Filled 2022-11-10: qty 4

## 2022-11-10 MED ORDER — BUPIVACAINE-EPINEPHRINE 0.25% -1:200000 IJ SOLN
INTRAMUSCULAR | Status: DC | PRN
  Administered 2022-11-10: 16 mL

## 2022-11-10 MED ORDER — LIDOCAINE 2% (20 MG/ML) 5 ML SYRINGE
INTRAMUSCULAR | Status: AC
  Filled 2022-11-10: qty 15

## 2022-11-10 MED ORDER — PHENYLEPHRINE 80 MCG/ML (10ML) SYRINGE FOR IV PUSH (FOR BLOOD PRESSURE SUPPORT)
PREFILLED_SYRINGE | INTRAVENOUS | Status: DC | PRN
  Administered 2022-11-10: 80 ug via INTRAVENOUS
  Administered 2022-11-10: 160 ug via INTRAVENOUS
  Administered 2022-11-10 (×2): 80 ug via INTRAVENOUS

## 2022-11-10 MED ORDER — CEFAZOLIN SODIUM-DEXTROSE 2-4 GM/100ML-% IV SOLN
INTRAVENOUS | Status: AC
  Filled 2022-11-10: qty 100

## 2022-11-10 MED ORDER — FENTANYL CITRATE (PF) 250 MCG/5ML IJ SOLN
INTRAMUSCULAR | Status: DC | PRN
  Administered 2022-11-10: 50 ug via INTRAVENOUS
  Administered 2022-11-10: 25 ug via INTRAVENOUS

## 2022-11-10 MED ORDER — SUCCINYLCHOLINE CHLORIDE 200 MG/10ML IV SOSY
PREFILLED_SYRINGE | INTRAVENOUS | Status: AC
  Filled 2022-11-10: qty 20

## 2022-11-10 MED ORDER — PROPOFOL 10 MG/ML IV BOLUS
INTRAVENOUS | Status: AC
  Filled 2022-11-10: qty 20

## 2022-11-10 MED ORDER — BUPIVACAINE-EPINEPHRINE (PF) 0.25% -1:200000 IJ SOLN
INTRAMUSCULAR | Status: AC
  Filled 2022-11-10: qty 30

## 2022-11-10 MED ORDER — KETOROLAC TROMETHAMINE 30 MG/ML IJ SOLN
INTRAMUSCULAR | Status: DC | PRN
  Administered 2022-11-10: 15 mg via INTRAVENOUS

## 2022-11-10 MED ORDER — PHENYLEPHRINE 80 MCG/ML (10ML) SYRINGE FOR IV PUSH (FOR BLOOD PRESSURE SUPPORT)
PREFILLED_SYRINGE | INTRAVENOUS | Status: AC
  Filled 2022-11-10: qty 20

## 2022-11-10 MED ORDER — CHLORHEXIDINE GLUCONATE CLOTH 2 % EX PADS: 6.0000 | MEDICATED_PAD | Freq: Once | CUTANEOUS | Status: DC

## 2022-11-10 MED ORDER — LACTATED RINGERS IV SOLN: INTRAVENOUS | Status: DC

## 2022-11-10 MED ORDER — DEXAMETHASONE SODIUM PHOSPHATE 10 MG/ML IJ SOLN
INTRAMUSCULAR | Status: DC | PRN
  Administered 2022-11-10: 4 mg via INTRAVENOUS

## 2022-11-10 MED ORDER — ACETAMINOPHEN 500 MG PO TABS: 1000.0000 mg | ORAL_TABLET | ORAL | Status: AC

## 2022-11-10 MED ORDER — EPHEDRINE 5 MG/ML INJ
INTRAVENOUS | Status: AC
  Filled 2022-11-10: qty 10

## 2022-11-10 SURGICAL SUPPLY — 47 items
ADH SKN CLS APL DERMABOND .7 (GAUZE/BANDAGES/DRESSINGS) ×1
APL PRP STRL LF DISP 70% ISPRP (MISCELLANEOUS) ×1
APL SKNCLS STERI-STRIP NONHPOA (GAUZE/BANDAGES/DRESSINGS) ×1
APPLIER CLIP 9.375 MED OPEN (MISCELLANEOUS) ×1
APR CLP MED 9.3 20 MLT OPN (MISCELLANEOUS) ×1
BAG COUNTER SPONGE SURGICOUNT (BAG) ×2 IMPLANT
BAG SPNG CNTER NS LX DISP (BAG) ×1
BENZOIN TINCTURE PRP APPL 2/3 (GAUZE/BANDAGES/DRESSINGS) ×2 IMPLANT
BINDER BREAST LRG (GAUZE/BANDAGES/DRESSINGS) IMPLANT
BINDER BREAST XLRG (GAUZE/BANDAGES/DRESSINGS) IMPLANT
CANISTER SUCT 3000ML PPV (MISCELLANEOUS) ×2 IMPLANT
CHLORAPREP W/TINT 26 (MISCELLANEOUS) ×2 IMPLANT
CLIP APPLIE 9.375 MED OPEN (MISCELLANEOUS) IMPLANT
COVER PROBE W GEL 5X96 (DRAPES) ×2 IMPLANT
COVER SURGICAL LIGHT HANDLE (MISCELLANEOUS) ×2 IMPLANT
DERMABOND ADVANCED .7 DNX12 (GAUZE/BANDAGES/DRESSINGS) IMPLANT
DEVICE DUBIN SPECIMEN MAMMOGRA (MISCELLANEOUS) ×2 IMPLANT
DRAPE CHEST BREAST 15X10 FENES (DRAPES) ×2 IMPLANT
DRSG TEGADERM 4X4.75 (GAUZE/BANDAGES/DRESSINGS) ×2 IMPLANT
ELECT CAUTERY BLADE 6.4 (BLADE) ×2 IMPLANT
ELECT REM PT RETURN 9FT ADLT (ELECTROSURGICAL) ×1
ELECTRODE REM PT RTRN 9FT ADLT (ELECTROSURGICAL) ×2 IMPLANT
GAUZE SPONGE 2X2 8PLY STRL LF (GAUZE/BANDAGES/DRESSINGS) ×2 IMPLANT
GLOVE BIO SURGEON STRL SZ7 (GLOVE) ×2 IMPLANT
GLOVE BIOGEL PI IND STRL 6.5 (GLOVE) IMPLANT
GLOVE BIOGEL PI IND STRL 7.5 (GLOVE) ×2 IMPLANT
GLOVE BIOGEL PI MICRO STRL 6 (GLOVE) IMPLANT
GLOVE SURG ENC MOIS LTX SZ7 (GLOVE) IMPLANT
GOWN STRL REUS W/ TWL LRG LVL3 (GOWN DISPOSABLE) ×4 IMPLANT
GOWN STRL REUS W/TWL LRG LVL3 (GOWN DISPOSABLE) ×2
ILLUMINATOR WAVEGUIDE N/F (MISCELLANEOUS) IMPLANT
KIT BASIN OR (CUSTOM PROCEDURE TRAY) ×2 IMPLANT
KIT MARKER MARGIN INK (KITS) ×2 IMPLANT
LIGHT WAVEGUIDE WIDE FLAT (MISCELLANEOUS) IMPLANT
NDL HYPO 25GX1X1/2 BEV (NEEDLE) ×2 IMPLANT
NEEDLE HYPO 25GX1X1/2 BEV (NEEDLE) ×1 IMPLANT
NS IRRIG 1000ML POUR BTL (IV SOLUTION) ×2 IMPLANT
PACK GENERAL/GYN (CUSTOM PROCEDURE TRAY) ×2 IMPLANT
SPONGE T-LAP 4X18 ~~LOC~~+RFID (SPONGE) ×2 IMPLANT
STRIP CLOSURE SKIN 1/2X4 (GAUZE/BANDAGES/DRESSINGS) ×2 IMPLANT
SUT MNCRL AB 4-0 PS2 18 (SUTURE) ×2 IMPLANT
SUT SILK 2 0 SH (SUTURE) IMPLANT
SUT VIC AB 3-0 SH 27 (SUTURE) ×1
SUT VIC AB 3-0 SH 27X BRD (SUTURE) ×2 IMPLANT
SYR CONTROL 10ML LL (SYRINGE) ×2 IMPLANT
TOWEL GREEN STERILE (TOWEL DISPOSABLE) ×2 IMPLANT
TOWEL GREEN STERILE FF (TOWEL DISPOSABLE) ×2 IMPLANT

## 2022-11-10 NOTE — Discharge Instructions (Signed)
[facility] Office Phone Number [phone]  BREAST BIOPSY/ PARTIAL MASTECTOMY: POST OP INSTRUCTIONS  Always review your discharge instruction sheet given to you by the facility where your surgery was performed.  IF YOU HAVE DISABILITY OR FAMILY LEAVE FORMS, YOU MUST BRING THEM TO THE OFFICE FOR PROCESSING.  DO NOT GIVE THEM TO YOUR DOCTOR.  A prescription for pain medication may be given to you upon discharge.  Take your pain medication as prescribed, if needed.  If narcotic pain medicine is not needed, then you may take acetaminophen (Tylenol) or ibuprofen (Advil) as needed. Take your usually prescribed medications unless otherwise directed If you need a refill on your pain medication, please contact your pharmacy.  They will contact our office to request authorization.  Prescriptions will not be filled after 5pm or on week-ends. You should eat very light the first 24 hours after surgery, such as soup, crackers, pudding, etc.  Resume your normal diet the day after surgery. Most patients will experience some swelling and bruising in the breast.  Ice packs and a good support bra will help.  Swelling and bruising can take several days to resolve.  It is common to experience some constipation if taking pain medication after surgery.  Increasing fluid intake and taking a stool softener will usually help or prevent this problem from occurring.  A mild laxative (Milk of Magnesia or Miralax) should be taken according to package directions if there are no bowel movements after 48 hours. Unless discharge instructions indicate otherwise, you may remove your bandages 24-48 hours after surgery, and you may shower at that time.  You may have steri-strips (small skin tapes) in place directly over the incision.  These strips should be left on the skin for 7-10 days.  If your surgeon used skin glue on the incision, you may shower in 24 hours.  The glue will flake off over the next 2-3 weeks.  Any  sutures or staples will be removed at the office during your follow-up visit. ACTIVITIES:  You may resume regular daily activities (gradually increasing) beginning the next day.  Wearing a good support bra or sports bra minimizes pain and swelling.  You may have sexual intercourse when it is comfortable. You may drive when you no longer are taking prescription pain medication, you can comfortably wear a seatbelt, and you can safely maneuver your car and apply brakes. RETURN TO WORK:  ______________________________________________________________________________________ You should see your doctor in the office for a follow-up appointment approximately two weeks after your surgery.  Your doctor's nurse will typically make your follow-up appointment when she calls you with your pathology report.  Expect your pathology report 2-3 business days after your surgery.  You may call to check if you do not hear from us after three days. OTHER INSTRUCTIONS: _______________________________________________________________________________________________ _____________________________________________________________________________________________________________________________________ _____________________________________________________________________________________________________________________________________ _____________________________________________________________________________________________________________________________________  WHEN TO CALL YOUR DOCTOR: Fever over 101.0 Nausea and/or vomiting. Extreme swelling or bruising. Continued bleeding from incision. Increased pain, redness, or drainage from the incision.  The clinic staff is available to answer your questions during regular business hours.  Please don't hesitate to call and ask to speak to one of the nurses for clinical concerns.  If you have a medical emergency, go to the nearest emergency room or call 911.  A surgeon from [facility] is always on call at the hospital.  For further questions, please visit [email or web address]   

## 2022-11-10 NOTE — Anesthesia Procedure Notes (Signed)
Procedure Name: LMA Insertion Date/Time: 11/10/2022 12:49 PM  Performed by: Wilburn Cornelia, CRNAPre-anesthesia Checklist: Patient identified, Emergency Drugs available, Suction available, Patient being monitored and Timeout performed Patient Re-evaluated:Patient Re-evaluated prior to induction Oxygen Delivery Method: Circle system utilized Preoxygenation: Pre-oxygenation with 100% oxygen Induction Type: IV induction Ventilation: Mask ventilation without difficulty LMA: LMA inserted LMA Size: 4.0 Number of attempts: 1 Placement Confirmation: positive ETCO2 and breath sounds checked- equal and bilateral Tube secured with: Tape Dental Injury: Teeth and Oropharynx as per pre-operative assessment

## 2022-11-10 NOTE — Interval H&P Note (Signed)
History and Physical Interval Note:  11/10/2022 12:12 PM  Kara Pugh  has presented today for surgery, with the diagnosis of RIGHT BREAST COMPLEX SCLEROSING LESION.  The various methods of treatment have been discussed with the patient and family. After consideration of risks, benefits and other options for treatment, the patient has consented to  Procedure(s): RADIOACTIVE SEED GUIDED EXCISIONAL RIGHT BREAST BIOPSY (Right) as a surgical intervention.  The patient's history has been reviewed, patient examined, no change in status, stable for surgery.  I have reviewed the patient's chart and labs.  Questions were answered to the patient's satisfaction.     Maia Petties

## 2022-11-10 NOTE — Op Note (Signed)
Pre-op Diagnosis:  Right breast complex sclerosing lesion Post-op Diagnosis: same Procedure:  Right radioactive seed localized lumpectomy Surgeon:  Kilian Schwartz K. Anesthesia:  GEN - LMA Indications:  This is an 82 year old female with a history of left breast cancer at least 20 years ago.  She was treated with a left lumpectomy followed by radiation and tamoxifen.  Apparently she was a subject in the tamoxifen trials.  She had been doing well until about 5 years ago.  She had a right breast mass seen on mammogram and underwent a biopsy in 2018.  This revealed a hamartoma and was felt to be benign.   Recently, she underwent further imaging that revealed that this mass had become larger.  She now has a 1.4 x 0.9 x 1.2 cm mass in the retroareolar region at 12:00.  She underwent biopsy of this area which revealed a complex sclerosing lesion with no sign of malignancy.  She did have some bruising after her biopsy.  Description of procedure: The patient is brought to the operating room placed in supine position on the operating room table. After an adequate level of general anesthesia was obtained, her right breast was prepped with ChloraPrep and draped in sterile fashion. A timeout was taken to ensure the proper patient and proper procedure. We interrogated the breast with the neoprobe. We made a circumareolar incision around the upper side of the nipple after infiltrating with 0.25% Marcaine. Dissection was carried down in the breast tissue with cautery. We used the neoprobe to guide [place] towards the radioactive seed. The seed is located anteriorly in the retroareolar space.  We excised an area of tissue around the radioactive seed 2 cm in diameter. The specimen was removed and was oriented with a paint kit. Specimen mammogram showed the radioactive seed as well as the biopsy clip within the specimen. This was sent for pathologic examination. There is no residual radioactivity within the biopsy cavity. We  inspected carefully for hemostasis. The wound was thoroughly irrigated. The wound was closed with a deep layer of 3-0 Vicryl and a subcuticular layer of 4-0 Monocryl. Benzoin Steri-Strips were applied. The patient was then extubated and brought to the recovery room in stable condition. All sponge, instrument, and needle counts are correct.  Kara Pugh. Georgette Dover, MD, [facility]  General/ Trauma Surgery  11/10/2022 1:24 PM

## 2022-11-10 NOTE — Anesthesia Preprocedure Evaluation (Addendum)
Anesthesia Evaluation  Patient identified by MRN, date of birth, ID band Patient awake    Reviewed: Allergy & Precautions, H&P , NPO status , Patient's Chart, lab work & pertinent test results  Airway Mallampati: II   Neck ROM: full    Dental no notable dental hx. (+) Dental Advisory Given   Pulmonary asthma    breath sounds clear to auscultation       Cardiovascular  Rhythm:regular Rate:Normal  hypercholesterolemia   Neuro/Psych    GI/Hepatic ,,,(+) Hepatitis -, B  Endo/Other  Hypothyroidism    Renal/GU      Musculoskeletal  (+) Arthritis ,    Abdominal   Peds  Hematology   Anesthesia Other Findings   Reproductive/Obstetrics Left breast CA                             Anesthesia Physical Anesthesia Plan  ASA: 2  Anesthesia Plan: General   Post-op Pain Management:    Induction: Intravenous  PONV Risk Score and Plan: 3 and Ondansetron, Dexamethasone and Treatment may vary due to age or medical condition  Airway Management Planned: LMA  Additional Equipment:   Intra-op Plan:   Post-operative Plan: Extubation in OR  Informed Consent: I have reviewed the patients History and Physical, chart, labs and discussed the procedure including the risks, benefits and alternatives for the proposed anesthesia with the patient or authorized representative who has indicated his/her understanding and acceptance.     Dental advisory given  Plan Discussed with: CRNA, Anesthesiologist and Surgeon  Anesthesia Plan Comments:        Anesthesia Quick Evaluation

## 2022-11-10 NOTE — Transfer of Care (Signed)
Immediate Anesthesia Transfer of Care Note  Patient: Kara Pugh  Procedure(s) Performed: RADIOACTIVE SEED GUIDED EXCISIONAL RIGHT BREAST BIOPSY (Right: Breast)  Patient Location: PACU  Anesthesia Type:General  Level of Consciousness: awake, alert , and oriented  Airway & Oxygen Therapy: Patient Spontanous Breathing and Patient connected to nasal cannula oxygen  Post-op Assessment: Report given to RN and Post -op Vital signs reviewed and stable  Post vital signs: Reviewed and stable  Last Vitals:  Vitals Value Taken Time  BP 96/78   Temp    Pulse 64 11/10/22 1333  Resp 12   SpO2 100% 11/10/22 1333  Vitals shown include unvalidated device data.  Last Pain:  Vitals:   11/10/22 1141  TempSrc:   PainSc: 0-No pain         Complications: No notable events documented.

## 2022-11-10 NOTE — Anesthesia Postprocedure Evaluation (Signed)
Anesthesia Post Note  Patient: Kara Pugh  Procedure(s) Performed: RADIOACTIVE SEED GUIDED EXCISIONAL RIGHT BREAST BIOPSY (Right: Breast)     Patient location during evaluation: PACU Anesthesia Type: General Level of consciousness: sedated Pain management: pain level controlled Vital Signs Assessment: post-procedure vital signs reviewed and stable Respiratory status: spontaneous breathing and respiratory function stable Cardiovascular status: stable Postop Assessment: no apparent nausea or vomiting Anesthetic complications: no  No notable events documented.  Last Vitals:  Vitals:   11/10/22 1400 11/10/22 1415  BP: 123/71   Pulse: 61 63  Resp: 14 19  Temp: 36.6 C   SpO2: 96% 96%    Last Pain:  Vitals:   11/10/22 1415  TempSrc:   PainSc: 0-No pain                 Randale Carvalho DANIEL

## 2022-11-11 ENCOUNTER — Encounter (HOSPITAL_COMMUNITY): Payer: Self-pay | Admitting: Surgery

## 2022-11-12 LAB — SURGICAL PATHOLOGY

## 2023-02-08 DIAGNOSIS — E7800 Pure hypercholesterolemia, unspecified: Secondary | ICD-10-CM | POA: Diagnosis not present

## 2023-02-08 DIAGNOSIS — E039 Hypothyroidism, unspecified: Secondary | ICD-10-CM | POA: Diagnosis not present

## 2023-02-08 DIAGNOSIS — I700 Atherosclerosis of aorta: Secondary | ICD-10-CM | POA: Diagnosis not present

## 2023-02-11 DIAGNOSIS — Z0000 Encounter for general adult medical examination without abnormal findings: Secondary | ICD-10-CM | POA: Diagnosis not present

## 2023-05-10 DIAGNOSIS — I10 Essential (primary) hypertension: Secondary | ICD-10-CM | POA: Diagnosis not present

## 2023-08-02 DIAGNOSIS — M7989 Other specified soft tissue disorders: Secondary | ICD-10-CM | POA: Diagnosis not present

## 2023-08-04 ENCOUNTER — Other Ambulatory Visit: Payer: Self-pay | Admitting: Family Medicine

## 2023-08-04 DIAGNOSIS — M7989 Other specified soft tissue disorders: Secondary | ICD-10-CM

## 2023-08-23 ENCOUNTER — Ambulatory Visit
Admission: RE | Admit: 2023-08-23 | Discharge: 2023-08-23 | Disposition: A | Discharge status: Home / Self Care | Payer: Medicare Other | Source: Ambulatory Visit | Attending: Family Medicine | Admitting: Family Medicine

## 2023-08-23 ENCOUNTER — Other Ambulatory Visit: Payer: Self-pay | Admitting: Family Medicine

## 2023-08-23 DIAGNOSIS — M7989 Other specified soft tissue disorders: Secondary | ICD-10-CM

## 2023-08-23 DIAGNOSIS — N6332 Unspecified lump in axillary tail of the left breast: Secondary | ICD-10-CM | POA: Diagnosis not present

## 2023-08-23 DIAGNOSIS — N6320 Unspecified lump in the left breast, unspecified quadrant: Secondary | ICD-10-CM

## 2023-08-24 ENCOUNTER — Other Ambulatory Visit: Payer: Self-pay | Admitting: Family Medicine

## 2023-08-24 ENCOUNTER — Ambulatory Visit
Admission: RE | Admit: 2023-08-24 | Discharge: 2023-08-24 | Disposition: A | Discharge status: Home / Self Care | Payer: Medicare Other | Source: Ambulatory Visit | Attending: Family Medicine | Admitting: Family Medicine

## 2023-08-24 DIAGNOSIS — N6332 Unspecified lump in axillary tail of the left breast: Secondary | ICD-10-CM | POA: Diagnosis not present

## 2023-08-24 DIAGNOSIS — N6320 Unspecified lump in the left breast, unspecified quadrant: Secondary | ICD-10-CM

## 2023-08-24 DIAGNOSIS — Z853 Personal history of malignant neoplasm of breast: Secondary | ICD-10-CM | POA: Diagnosis not present

## 2023-08-24 DIAGNOSIS — C773 Secondary and unspecified malignant neoplasm of axilla and upper limb lymph nodes: Secondary | ICD-10-CM | POA: Diagnosis not present

## 2023-08-24 DIAGNOSIS — M7989 Other specified soft tissue disorders: Secondary | ICD-10-CM

## 2023-08-24 DIAGNOSIS — R590 Localized enlarged lymph nodes: Secondary | ICD-10-CM | POA: Diagnosis not present

## 2023-08-24 DIAGNOSIS — Z170 Estrogen receptor positive status [ER+]: Secondary | ICD-10-CM | POA: Diagnosis not present

## 2023-08-29 LAB — SURGICAL PATHOLOGY

## 2023-09-19 ENCOUNTER — Ambulatory Visit: Payer: Self-pay | Admitting: Surgery

## 2023-09-19 ENCOUNTER — Other Ambulatory Visit: Payer: Self-pay | Admitting: Surgery

## 2023-09-19 DIAGNOSIS — C50912 Malignant neoplasm of unspecified site of left female breast: Secondary | ICD-10-CM

## 2023-09-19 NOTE — H&P (View-Only) (Signed)
Subjective    Chief Complaint: New Consultation and Breast Cancer       History of Present Illness: Kara Pugh is a 83 y.o. female who is seen today as an office consultation at the request of Dr. Sallee Lange for evaluation of New Consultation and Breast Cancer .   This is an 83 year old female with a history of left breast cancer over 20 years ago.  She was treated with a left lumpectomy followed by radiation and tamoxifen.  Apparently she was a subject in the tamoxifen trials.  She had been doing well until about 5 years ago.  She had a right breast mass seen on mammogram and underwent a biopsy in 2018.  This revealed a hamartoma and was felt to be benign.   In January 2024, she underwent further imaging that revealed that this mass had become larger.  She had a 1.4 x 0.9 x 1.2 cm mass in the retroareolar region at 12:00.  She underwent biopsy of this area which revealed a complex sclerosing lesion with no sign of malignancy.  On 11/10/2022, she underwent right breast radioactive seed localized lumpectomy.  Pathology confirmed complex sclerosing lesion and a sclerosed papilloma with no sign of malignancy.   In the last 2 months, patient developed a palpable mass in her left axilla.  She underwent diagnostic mammogram and ultrasound.  This revealed a 1.5 x 1.5 x 1.9 cm hypoechoic left axillary mass.  It was unclear whether this was a mass versus a regular lymph node.  She went biopsy of this area that revealed invasive ductal carcinoma.  There was no evidence of nodal tissue.  ER/PR strongly positive, Ki-67 15%, HER2 negative.  The patient presents now for surgical evaluation.  She has not had any further imaging other than the diagnostic mammogram in early December and the axillary ultrasound to look for other primary sources.         Medical History: Past Medical History      Past Medical History:  Diagnosis Date   Asthma, unspecified asthma severity, unspecified whether complicated,  unspecified whether persistent (HHS-HCC)     Thyroid disease          Problem List     Patient Active Problem List  Diagnosis   Complex sclerosing lesion of right breast        Past Surgical History       Past Surgical History:  Procedure Laterality Date   HYSTERECTOMY            Allergies       Allergies  Allergen Reactions   Codeine Hives      rash   Latex Dermatitis   Sulfa (Sulfonamide Antibiotics) Diarrhea        Medications Ordered Prior to Encounter        Current Outpatient Medications on File Prior to Visit  Medication Sig Dispense Refill   cholecalciferol (VITAMIN D3) 1000 unit tablet Take 1,000 Units by mouth once daily       levothyroxine (SYNTHROID) 112 MCG tablet TAKE 1 TABLET BY MOUTH EVERY MORNING ON AN EMPTY STOMACH ONCE A DAY 90 DAYS       loratadine (CLARITIN) 10 mg tablet Take 10 mg by mouth once daily       losartan (COZAAR) 25 MG tablet Take 25 mg by mouth once daily       simvastatin (ZOCOR) 20 MG tablet Take 20 mg by mouth once daily        No current facility-administered  medications on file prior to visit.        Family History       Family History  Problem Relation Age of Onset   Breast cancer Mother          Tobacco Use History  Social History       Tobacco Use  Smoking Status Never  Smokeless Tobacco Never        Social History  Social History        Socioeconomic History   Marital status: Married  Tobacco Use   Smoking status: Never   Smokeless tobacco: Never  Substance and Sexual Activity   Alcohol use: Yes   Drug use: Never    Social Drivers of Health          Received from [employer], [facility]    Social Network  Housing Stability: Unknown (09/19/2023)    Housing Stability Vital Sign     Homeless in the Last Year: No        Objective:         Vitals:    09/19/23 1126  Pulse: 83  Temp: 36.1 C (97 F)  SpO2: 98%  Weight: 75.3 kg (166 lb)  Height: 157.5 cm (5\' 2" )    Body mass index  is 30.36 kg/m.   Physical Exam    Constitutional:  WDWN in NAD, conversant, no obvious deformities; lying in bed comfortably Eyes:  Pupils equal, round; sclera anicteric; moist conjunctiva; no lid lag HENT:  Oral mucosa moist; good dentition  Neck:  No masses palpated, trachea midline; no thyromegaly Lungs:  CTA bilaterally; normal respiratory effort Breasts:  symmetric, no nipple changes; no palpable masses or lymphadenopathy on right side. Left breast - no palpable masses; left posterior axilla - mobile 2 cm mass in the subcutaneous tissues; no lymphadenopathy CV:  Regular rate and rhythm; no murmurs; extremities well-perfused with no edema Abd:  +bowel sounds, soft, non-tender, no palpable organomegaly; no palpable hernias Musc:  Normal gait; no apparent clubbing or cyanosis in extremities Lymphatic:  No palpable cervical or axillary lymphadenopathy Skin:  Warm, dry; no sign of jaundice Psychiatric - alert and oriented x 4; calm mood and affect     Labs, Imaging and Diagnostic Testing:   MRN: [id number] Physician: Arnell Sieving. DOB/Age [birth year] (Age: 83) Gender: F Collected Date: 08/24/2023 Received Date: 08/24/2023  FINAL DIAGNOSIS       1. Lymph node, needle/core biopsy, left axilla (hydromark spiral clip) :      -  DUCTAL CARCINOMA WITH SOLID AND PAPILLARY FEATURES      -  SEE COMMENT       DATE SIGNED OUT: 08/29/2023 ELECTRONIC SIGNATURE : Charm Barges Md, Dawn, Pathologist, Electronic Signature  MICROSCOPIC DESCRIPTION 1. The biopsy consists of multiple fragments composed of a fairly monotonous cell population with fibrovascular cores. No definitive evidence of residual nodal tissue is identified by routine histology.  Immunohistochemistry for CD20 and CD3 confirms the absence of lymphocytes (nodal tissue).  The neoplastic cells are positive for cytokeratin AE1/3, E-cadherin, and Gata-3 supporting the above diagnosis.  The proliferative rate by ki-67 is overall  low ( 10-15). Based on the biopsy, the carcinoma appears Nottingham grade 2 of 3 and measures 0.9 cm in greatest linear extent. Prognostic markers (ER/PR/ki-67/HER2) are pending and will be reported in an addendum. These results were called to [facility] on August 29, 2023.  CASE COMMENTS STAINS USED IN DIAGNOSIS: H&E CUT 9 BLANKS FOR IHC  RNA Negative RNA Positive Universal Negative Control-DAB *RECUT 1 SLIDE Universal Negative Control-DAB Stains used in diagnosis 1 BCl 2, 1 BCl 6 , 1 CD-10, 1 CD 20, 1 CD 3, 1 CD 5, 1 EBV-ISH, 1 KAPPA-ISH, 1 KI-67-NO ACIS, 1 LAMBDA-ISH, 1 CK AE1AE3, 1 E-CAD, 1 GATA-3, 1 Her2 by IHC, 1 ER-ACIS, 1 KI-67-ACIS, 1 PR-ACIS Some of these immunohistochemical stains may have been developed and the performance characteristics determined by [facility].  Some may not have been cleared or approved by the U.S. Food and Drug Administration.  The FDA has determined that such clearance or approval is not necessary.  This test is used for clinical purposes.  It should not be regarded as investigational or for research.  This laboratory is certified under the Clinical Laboratory Improvement Amendments of 1988 (CLIA-88) as qualified to perform high complexity clinical laboratory testing. IHC stains are performed on formalin fixed,paraffin embedded tissue using a 3,3"diaminobenzidine (DAB) chromogen and Leica Bond Autostainer System. The staining intensity of the nucleus is score manually and is reported as the percentage of tumor cell nuclei demonstrating specific nuclear staining. The specimens are fixed in 10% Neutral Formalin for at least 6 hours and up to 72hrs. These tests are validated on decalcified tissue. Results should be interpreted with caution given the possibility of false negative results on decalcified specimens. Antibody Clones are as follows ER-clone 61F, PR-clone 16, Ki67- clone MM1.Some of these  immunohistochemical stains may have been developed and the performance characteristics determined by [facility]. IHC scores are reported using ASCO/CAP scoring criteria.  An IHC Score of 0 or 1+  is NEGATIVE for HER2, 3+ is POSITIVE for HER2, and 2+ is EQUIVOCAL. Equivocal results are reflexed to either FISH or IHC testing. Specimens are fixed in 10% Neutral Buffered Formalin for at least 6 hours and up to 72 hours. These tests have not be validated on decalcified tissue.  Results should be interpreted with caution given the possibility of false negative results on decalcified specimens. Antibody Clone for HER2 is 4B5 (PATHWAY). Some of these immunohistochemical stains may have been developed and the performance characteristics determined by [facility].  Some may not have been cleared or approved by the U.S. Food and Drug Administration.  The FDA has determined that such clearance or approval is not necessary.  This test is used for clinical purposes.  It should not be regarded as investigational or for research.  This laboratory is certified under the Clinical Laboratory Improvement Amendments of 1988 (CLIA-88) as qualified to perform high complexity clinical laboratory testing. Estrogen receptor (61F11), immunohistochemical stains are performed on formalin fixed, paraffin embedded tissue using a 3,3"-diaminobenzidine (DAB) chromogen and Leica Bond Autostainer System.  The staining intensity of the nucleus is scored manually and is reported as the percentage of tumor cell nuclei demonstrating specific nuclear staining.Specimens are fixed in 10% Neutral Buffered Formalin for at least 6 hours and up to 72 hours.  These tests have not be validated on decalcified tissue.  Results should be interpreted with caution given the possibility of false negative results on decalcified specimens. Ki-67 (MM1), immunohistochemical stains are performed on formalin fixed, paraffin  embedded tissue using a 3,3"-diaminobenzidine (DAB) chromogen and Leica Bond Autostainer System.  The staining intensity of the nucleus is scored manually and is reported as the percentage of tumor cell nuclei demonstrating specific nuclear staining.Specimens are fixed in 10% Neutral Buffered Formalin for at least 6 hours and up to 72 hours. These tests have not  be validated on decalcified tissue.  Results should be interpreted with caution given the possibility of false negative results on decalcified specimens. PR progesterone receptor (16), immunohistochemical stains are performed on formalin fixed, paraffin embedded tissue using a 3,3"-diaminobenzidine (DAB) chromogen and Leica Bond Autostainer System.  The staining intensity of the nucleus is scored manually and is reported as the percentage of tumor cell nuclei demonstrating specific nuclear staining.Specimens are fixed in 10% Neutral Buffered Formalin for at least 6 hours and up to 72 hours. These tests have not be validated on decalcified tissue.  Results should be interpreted with caution given the possibility of false negative results on decalcified specimens.  ADDENDUM Lymph node, needle/core biopsy, left axilla (hydromark spiral clip) PROGNOSTIC INDICATORS  Results: IMMUNOHISTOCHEMICAL AND MORPHOMETRIC ANALYSIS PERFORMED MANUALLY The tumor cells are negative for Her2 (1+). Estrogen Receptor:  95%, POSITIVE, STRONG STAINING INTENSITY Progesterone Receptor:  95%, POSITIVE, STRONG STAINING INTENSITY Proliferation Marker Ki67:  15%    CLINICAL DATA:  83 year old female presents with palpable LEFT axillary lump. Patient with history of remote LEFT breast cancer and lumpectomy and more recent excision of RIGHT breast complex sclerosing lesion.   EXAM: DIGITAL DIAGNOSTIC BILATERAL MAMMOGRAM WITH TOMOSYNTHESIS AND CAD; [place] AXILLARY LEFT   TECHNIQUE: Bilateral digital diagnostic mammography and breast tomosynthesis was  performed. The images were evaluated with computer-aided detection. ; Targeted ultrasound examination of the left axilla was performed.   COMPARISON:  Previous exam(s).   ACR Breast Density Category b: There are scattered areas of fibroglandular density.   FINDINGS: Full field views of both breasts and spot compression view of the LEFT axilla demonstrates a new mass/possible prominent lymph node in the LEFT axilla.   Surgical changes within both breasts identified.   No other new or suspicious mammographic findings within either breast noted.   Targeted ultrasound is performed, showing a 1.5 x 1.5 x 1.9 cm hypoechoic LEFT axillary mass with angular margins and internal vascularity. It is difficult to determine if this represents an irregular abnormal appearing lymph node versus other solid mass. No other abnormal LEFT axillary lymph nodes are noted.   IMPRESSION: 1. 1.9 cm LEFT axillary mass versus irregular abnormal lymph node. Tissue sampling is recommended. No other abnormal LEFT axillary lymph nodes noted. 2. No new or suspicious mammographic findings within either breast. Surgical changes within both breast.   RECOMMENDATION: Ultrasound-guided biopsy of LEFT axillary mass, which will be scheduled.   I have discussed the findings and recommendations with the patient. If applicable, a reminder letter will be sent to the patient regarding the next appointment.   BI-RADS CATEGORY  4: Suspicious.     Electronically Signed   By: Harmon Pier M.D.   On: 08/23/2023 13:24     Assessment and Plan:  Diagnoses and all orders for this visit:   Invasive ductal carcinoma of breast, left (CMS/HHS-HCC)     On physical examination, the palpable mass appears to be a axillary tail breast mass and does not seem consistent with a lymph node.  We will confirm this with bilateral breast MRI  Assuming that the mass represents the primary cancer in the axillary tail, recommend left  axillary breast lumpectomy.  The mass is easily palpable so there is no need for localization.   The surgical procedure has been discussed with the patient.  Potential risks, benefits, alternative treatments, and expected outcomes have been explained.  All of the patient's questions at this time have been answered.  The likelihood of reaching the  patient's treatment goal is good.  The patient understands the proposed surgical procedure and wishes to proceed.     We will place referrals for oncology and radiation oncology.  We will order the breast MRI to be performed before surgery to rule out other primary breast tumors.   Smith Mcnicholas Delbert Harness, MD  09/19/2023 1:41 PM

## 2023-09-19 NOTE — H&P (Signed)
 Subjective    Chief Complaint: New Consultation and Breast Cancer       History of Present Illness: Kara Pugh is a 83 y.o. female who is seen today as an office consultation at the request of Dr. Sallee Lange for evaluation of New Consultation and Breast Cancer .   This is an 83 year old female with a history of left breast cancer over 20 years ago.  She was treated with a left lumpectomy followed by radiation and tamoxifen.  Apparently she was a subject in the tamoxifen trials.  She had been doing well until about 5 years ago.  She had a right breast mass seen on mammogram and underwent a biopsy in 2018.  This revealed a hamartoma and was felt to be benign.   In January 2024, she underwent further imaging that revealed that this mass had become larger.  She had a 1.4 x 0.9 x 1.2 cm mass in the retroareolar region at 12:00.  She underwent biopsy of this area which revealed a complex sclerosing lesion with no sign of malignancy.  On 11/10/2022, she underwent right breast radioactive seed localized lumpectomy.  Pathology confirmed complex sclerosing lesion and a sclerosed papilloma with no sign of malignancy.   In the last 2 months, patient developed a palpable mass in her left axilla.  She underwent diagnostic mammogram and ultrasound.  This revealed a 1.5 x 1.5 x 1.9 cm hypoechoic left axillary mass.  It was unclear whether this was a mass versus a regular lymph node.  She went biopsy of this area that revealed invasive ductal carcinoma.  There was no evidence of nodal tissue.  ER/PR strongly positive, Ki-67 15%, HER2 negative.  The patient presents now for surgical evaluation.  She has not had any further imaging other than the diagnostic mammogram in early December and the axillary ultrasound to look for other primary sources.         Medical History: Past Medical History      Past Medical History:  Diagnosis Date   Asthma, unspecified asthma severity, unspecified whether complicated,  unspecified whether persistent (HHS-HCC)     Thyroid disease          Problem List     Patient Active Problem List  Diagnosis   Complex sclerosing lesion of right breast        Past Surgical History       Past Surgical History:  Procedure Laterality Date   HYSTERECTOMY            Allergies       Allergies  Allergen Reactions   Codeine Hives      rash   Latex Dermatitis   Sulfa (Sulfonamide Antibiotics) Diarrhea        Medications Ordered Prior to Encounter        Current Outpatient Medications on File Prior to Visit  Medication Sig Dispense Refill   cholecalciferol (VITAMIN D3) 1000 unit tablet Take 1,000 Units by mouth once daily       levothyroxine (SYNTHROID) 112 MCG tablet TAKE 1 TABLET BY MOUTH EVERY MORNING ON AN EMPTY STOMACH ONCE A DAY 90 DAYS       loratadine (CLARITIN) 10 mg tablet Take 10 mg by mouth once daily       losartan (COZAAR) 25 MG tablet Take 25 mg by mouth once daily       simvastatin (ZOCOR) 20 MG tablet Take 20 mg by mouth once daily        No current facility-administered  medications on file prior to visit.        Family History       Family History  Problem Relation Age of Onset   Breast cancer Mother          Tobacco Use History  Social History       Tobacco Use  Smoking Status Never  Smokeless Tobacco Never        Social History  Social History        Socioeconomic History   Marital status: Married  Tobacco Use   Smoking status: Never   Smokeless tobacco: Never  Substance and Sexual Activity   Alcohol use: Yes   Drug use: Never    Social Drivers of Health          Received from [employer], [facility]    Social Network  Housing Stability: Unknown (09/19/2023)    Housing Stability Vital Sign     Homeless in the Last Year: No        Objective:         Vitals:    09/19/23 1126  Pulse: 83  Temp: 36.1 C (97 F)  SpO2: 98%  Weight: 75.3 kg (166 lb)  Height: 157.5 cm (5\' 2" )    Body mass index  is 30.36 kg/m.   Physical Exam    Constitutional:  WDWN in NAD, conversant, no obvious deformities; lying in bed comfortably Eyes:  Pupils equal, round; sclera anicteric; moist conjunctiva; no lid lag HENT:  Oral mucosa moist; good dentition  Neck:  No masses palpated, trachea midline; no thyromegaly Lungs:  CTA bilaterally; normal respiratory effort Breasts:  symmetric, no nipple changes; no palpable masses or lymphadenopathy on right side. Left breast - no palpable masses; left posterior axilla - mobile 2 cm mass in the subcutaneous tissues; no lymphadenopathy CV:  Regular rate and rhythm; no murmurs; extremities well-perfused with no edema Abd:  +bowel sounds, soft, non-tender, no palpable organomegaly; no palpable hernias Musc:  Normal gait; no apparent clubbing or cyanosis in extremities Lymphatic:  No palpable cervical or axillary lymphadenopathy Skin:  Warm, dry; no sign of jaundice Psychiatric - alert and oriented x 4; calm mood and affect     Labs, Imaging and Diagnostic Testing:   MRN: [id number] Physician: Arnell Sieving. DOB/Age [birth year] (Age: 83) Gender: F Collected Date: 08/24/2023 Received Date: 08/24/2023  FINAL DIAGNOSIS       1. Lymph node, needle/core biopsy, left axilla (hydromark spiral clip) :      -  DUCTAL CARCINOMA WITH SOLID AND PAPILLARY FEATURES      -  SEE COMMENT       DATE SIGNED OUT: 08/29/2023 ELECTRONIC SIGNATURE : Charm Barges Md, Dawn, Pathologist, Electronic Signature  MICROSCOPIC DESCRIPTION 1. The biopsy consists of multiple fragments composed of a fairly monotonous cell population with fibrovascular cores. No definitive evidence of residual nodal tissue is identified by routine histology.  Immunohistochemistry for CD20 and CD3 confirms the absence of lymphocytes (nodal tissue).  The neoplastic cells are positive for cytokeratin AE1/3, E-cadherin, and Gata-3 supporting the above diagnosis.  The proliferative rate by ki-67 is overall  low ( 10-15). Based on the biopsy, the carcinoma appears Nottingham grade 2 of 3 and measures 0.9 cm in greatest linear extent. Prognostic markers (ER/PR/ki-67/HER2) are pending and will be reported in an addendum. These results were called to [facility] on August 29, 2023.  CASE COMMENTS STAINS USED IN DIAGNOSIS: H&E CUT 9 BLANKS FOR IHC  RNA Negative RNA Positive Universal Negative Control-DAB *RECUT 1 SLIDE Universal Negative Control-DAB Stains used in diagnosis 1 BCl 2, 1 BCl 6 , 1 CD-10, 1 CD 20, 1 CD 3, 1 CD 5, 1 EBV-ISH, 1 KAPPA-ISH, 1 KI-67-NO ACIS, 1 LAMBDA-ISH, 1 CK AE1AE3, 1 E-CAD, 1 GATA-3, 1 Her2 by IHC, 1 ER-ACIS, 1 KI-67-ACIS, 1 PR-ACIS Some of these immunohistochemical stains may have been developed and the performance characteristics determined by [facility].  Some may not have been cleared or approved by the U.S. Food and Drug Administration.  The FDA has determined that such clearance or approval is not necessary.  This test is used for clinical purposes.  It should not be regarded as investigational or for research.  This laboratory is certified under the Clinical Laboratory Improvement Amendments of 1988 (CLIA-88) as qualified to perform high complexity clinical laboratory testing. IHC stains are performed on formalin fixed,paraffin embedded tissue using a 3,3"diaminobenzidine (DAB) chromogen and Leica Bond Autostainer System. The staining intensity of the nucleus is score manually and is reported as the percentage of tumor cell nuclei demonstrating specific nuclear staining. The specimens are fixed in 10% Neutral Formalin for at least 6 hours and up to 72hrs. These tests are validated on decalcified tissue. Results should be interpreted with caution given the possibility of false negative results on decalcified specimens. Antibody Clones are as follows ER-clone 61F, PR-clone 16, Ki67- clone MM1.Some of these  immunohistochemical stains may have been developed and the performance characteristics determined by [facility] Path[facility]. IHC scores are reported using ASCO/CAP scoring criteria.  An IHC Score of 0 or 1+  is NEGATIVE for HER2, 3+ is POSITIVE for HER2, and 2+ is EQUIVOCAL. Equivocal results are reflexed to either FISH or IHC testing. Specimens are fixed in 10% Neutral Buffered Formalin for at least 6 hours and up to 72 hours. These tests have not be validated on decalcified tissue.  Results should be interpreted with caution given the possibility of false negative results on decalcified specimens. Antibody Clone for HER2 is 4B5 (PATHWAY). Some of these immunohistochemical stains may have been developed and the performance characteristics determined by [facility].  Some may not have been cleared or approved by the U.S. Food and Drug Administration.  The FDA has determined that such clearance or approval is not necessary.  This test is used for clinical purposes.  It should not be regarded as investigational or for research.  This laboratory is certified under the Clinical Laboratory Improvement Amendments of 1988 (CLIA-88) as qualified to perform high complexity clinical laboratory testing. Estrogen receptor (61F11), immunohistochemical stains are performed on formalin fixed, paraffin embedded tissue using a 3,3"-diaminobenzidine (DAB) chromogen and Leica Bond Autostainer System.  The staining intensity of the nucleus is scored manually and is reported as the percentage of tumor cell nuclei demonstrating specific nuclear staining.Specimens are fixed in 10% Neutral Buffered Formalin for at least 6 hours and up to 72 hours.  These tests have not be validated on decalcified tissue.  Results should be interpreted with caution given the possibility of false negative results on decalcified specimens. Ki-67 (MM1), immunohistochemical stains are performed on formalin fixed, paraffin  embedded tissue using a 3,3"-diaminobenzidine (DAB) chromogen and Leica Bond Autostainer System.  The staining intensity of the nucleus is scored manually and is reported as the percentage of tumor cell nuclei demonstrating specific nuclear staining.Specimens are fixed in 10% Neutral Buffered Formalin for at least 6 hours and up to 72 hours. These tests have not  be validated on decalcified tissue.  Results should be interpreted with caution given the possibility of false negative results on decalcified specimens. PR progesterone receptor (16), immunohistochemical stains are performed on formalin fixed, paraffin embedded tissue using a 3,3"-diaminobenzidine (DAB) chromogen and Leica Bond Autostainer System.  The staining intensity of the nucleus is scored manually and is reported as the percentage of tumor cell nuclei demonstrating specific nuclear staining.Specimens are fixed in 10% Neutral Buffered Formalin for at least 6 hours and up to 72 hours. These tests have not be validated on decalcified tissue.  Results should be interpreted with caution given the possibility of false negative results on decalcified specimens.  ADDENDUM Lymph node, needle/core biopsy, left axilla (hydromark spiral clip) PROGNOSTIC INDICATORS  Results: IMMUNOHISTOCHEMICAL AND MORPHOMETRIC ANALYSIS PERFORMED MANUALLY The tumor cells are negative for Her2 (1+). Estrogen Receptor:  95%, POSITIVE, STRONG STAINING INTENSITY Progesterone Receptor:  95%, POSITIVE, STRONG STAINING INTENSITY Proliferation Marker Ki67:  15%    CLINICAL DATA:  83 year old female presents with palpable LEFT axillary lump. Patient with history of remote LEFT breast cancer and lumpectomy and more recent excision of RIGHT breast complex sclerosing lesion.   EXAM: DIGITAL DIAGNOSTIC BILATERAL MAMMOGRAM WITH TOMOSYNTHESIS AND CAD; [place] AXILLARY LEFT   TECHNIQUE: Bilateral digital diagnostic mammography and breast tomosynthesis was  performed. The images were evaluated with computer-aided detection. ; Targeted ultrasound examination of the left axilla was performed.   COMPARISON:  Previous exam(s).   ACR Breast Density Category b: There are scattered areas of fibroglandular density.   FINDINGS: Full field views of both breasts and spot compression view of the LEFT axilla demonstrates a new mass/possible prominent lymph node in the LEFT axilla.   Surgical changes within both breasts identified.   No other new or suspicious mammographic findings within either breast noted.   Targeted ultrasound is performed, showing a 1.5 x 1.5 x 1.9 cm hypoechoic LEFT axillary mass with angular margins and internal vascularity. It is difficult to determine if this represents an irregular abnormal appearing lymph node versus other solid mass. No other abnormal LEFT axillary lymph nodes are noted.   IMPRESSION: 1. 1.9 cm LEFT axillary mass versus irregular abnormal lymph node. Tissue sampling is recommended. No other abnormal LEFT axillary lymph nodes noted. 2. No new or suspicious mammographic findings within either breast. Surgical changes within both breast.   RECOMMENDATION: Ultrasound-guided biopsy of LEFT axillary mass, which will be scheduled.   I have discussed the findings and recommendations with the patient. If applicable, a reminder letter will be sent to the patient regarding the next appointment.   BI-RADS CATEGORY  4: Suspicious.     Electronically Signed   By: Harmon Pier M.D.   On: 08/23/2023 13:24     Assessment and Plan:  Diagnoses and all orders for this visit:   Invasive ductal carcinoma of breast, left (CMS/HHS-HCC)     On physical examination, the palpable mass appears to be a axillary tail breast mass and does not seem consistent with a lymph node.  We will confirm this with bilateral breast MRI  Assuming that the mass represents the primary cancer in the axillary tail, recommend left  axillary breast lumpectomy.  The mass is easily palpable so there is no need for localization.   The surgical procedure has been discussed with the patient.  Potential risks, benefits, alternative treatments, and expected outcomes have been explained.  All of the patient's questions at this time have been answered.  The likelihood of reaching the  patient's treatment goal is good.  The patient understands the proposed surgical procedure and wishes to proceed.     We will place referrals for oncology and radiation oncology.  We will order the breast MRI to be performed before surgery to rule out other primary breast tumors.   Smith Mcnicholas Delbert Harness, MD  09/19/2023 1:41 PM

## 2023-09-22 NOTE — Progress Notes (Signed)
 Surgical Instructions   Your procedure is scheduled on Thursday, January 16th, 2025. Report to [facility] Main Entrance A at 5:30 A.M., then check in with the Admitting office. Any questions or running late day of surgery: call [phone]  Questions prior to your surgery date: call [phone], Monday-Friday, 8am-4pm. If you experience any cold or flu symptoms such as cough, fever, chills, shortness of breath, etc. between now and your scheduled surgery, please notify us  at the above number.     Remember:  Do not eat after midnight the night before your surgery   You may drink clear liquids until 4:30 the morning of your surgery.   Clear liquids allowed are: Water, Non-Citrus Juices (without pulp), Carbonated Beverages, Clear Tea (no milk, honey, etc.), Black Coffee Only (NO MILK, CREAM OR POWDERED CREAMER of any kind), and Gatorade.    Take these medicines the morning of surgery with A SIP OF WATER: Levothyroxine (Synthroid) Loratadine (Claritin) Simvastatin (Zocor)   May take these medicines IF NEEDED: None.     One week prior to surgery, STOP taking any Aspirin (unless otherwise instructed by your surgeon) Aleve, Naproxen, Ibuprofen, Motrin, Advil, Goody's, BC's, all herbal medications, fish oil, and non-prescription vitamins.                     Do NOT Smoke (Tobacco/Vaping) for 24 hours prior to your procedure.  If you use a CPAP at night, you may bring your mask/headgear for your overnight stay.   You will be asked to remove any contacts, glasses, piercing's, hearing aid's, dentures/partials prior to surgery. Please bring cases for these items if needed.    Patients discharged the day of surgery will not be allowed to drive home, and someone needs to stay with them for 24 hours.  SURGICAL WAITING ROOM VISITATION Patients may have no more than 2 support people in the waiting area - these visitors may rotate.   Pre-op nurse will coordinate an appropriate time for 1  ADULT support person, who may not rotate, to accompany patient in pre-op.  Children under the age of 27 must have an adult with them who is not the patient and must remain in the main waiting area with an adult.  If the patient needs to stay at the hospital during part of their recovery, the visitor guidelines for inpatient rooms apply.  Please refer to the [facility] website for the visitor guidelines for any additional information.   If you received a COVID test during your pre-op visit  it is requested that you wear a mask when out in public, stay away from anyone that may not be feeling well and notify your surgeon if you develop symptoms. If you have been in contact with anyone that has tested positive in the last 10 days please notify you surgeon.      Pre-operative CHG Bathing Instructions   You can play a key role in reducing the risk of infection after surgery. Your skin needs to be as free of germs as possible. You can reduce the number of germs on your skin by washing with CHG (chlorhexidine  gluconate) soap before surgery. CHG is an antiseptic soap that kills germs and continues to kill germs even after washing.   DO NOT use if you have an allergy to chlorhexidine /CHG or antibacterial soaps. If your skin becomes reddened or irritated, stop using the CHG and notify one of our RNs at [phone].  TAKE A SHOWER THE NIGHT BEFORE SURGERY AND THE DAY OF SURGERY    Please keep in mind the following:  DO NOT shave, including legs and underarms, 48 hours prior to surgery.   You may shave your face before/day of surgery.  Place clean sheets on your bed the night before surgery Use a clean washcloth (not used since being washed) for each shower. DO NOT sleep with pet's night before surgery.  CHG Shower Instructions:  Wash your face and private area with normal soap. If you choose to wash your hair, wash first with your normal shampoo.  After you use shampoo/soap, rinse  your hair and body thoroughly to remove shampoo/soap residue.  Turn the water OFF and apply half the bottle of CHG soap to a CLEAN washcloth.  Apply CHG soap ONLY FROM YOUR NECK DOWN TO YOUR TOES (washing for 3-5 minutes)  DO NOT use CHG soap on face, private areas, open wounds, or sores.  Pay special attention to the area where your surgery is being performed.  If you are having back surgery, having someone wash your back for you may be helpful. Wait 2 minutes after CHG soap is applied, then you may rinse off the CHG soap.  Pat dry with a clean towel  Put on clean pajamas    Additional instructions for the day of surgery: DO NOT APPLY any lotions, deodorants, cologne, or perfumes.   Do not wear jewelry or makeup Do not wear nail polish, gel polish, artificial nails, or any other type of covering on natural nails (fingers and toes) Do not bring valuables to the hospital. [facility] is not responsible for valuables/personal belongings. Put on clean/comfortable clothes.  Please brush your teeth.  Ask your nurse before applying any prescription medications to the skin.

## 2023-09-23 ENCOUNTER — Encounter (HOSPITAL_COMMUNITY)
Admission: RE | Admit: 2023-09-23 | Discharge: 2023-09-23 | Disposition: A | Discharge status: Home / Self Care | Payer: Medicare Other | Source: Ambulatory Visit | Attending: Surgery | Admitting: Surgery

## 2023-09-23 ENCOUNTER — Other Ambulatory Visit: Payer: Self-pay

## 2023-09-23 ENCOUNTER — Encounter (HOSPITAL_COMMUNITY): Payer: Self-pay

## 2023-09-23 ENCOUNTER — Ambulatory Visit
Admission: RE | Admit: 2023-09-23 | Discharge: 2023-09-23 | Disposition: A | Discharge status: Home / Self Care | Payer: Medicare Other | Source: Ambulatory Visit | Attending: Surgery | Admitting: Surgery

## 2023-09-23 VITALS — BP 135/77 | HR 64 | Temp 98.1°F | Resp 18 | Ht 62.0 in | Wt 164.1 lb

## 2023-09-23 DIAGNOSIS — Z01818 Encounter for other preprocedural examination: Secondary | ICD-10-CM | POA: Diagnosis not present

## 2023-09-23 DIAGNOSIS — N6320 Unspecified lump in the left breast, unspecified quadrant: Secondary | ICD-10-CM | POA: Diagnosis not present

## 2023-09-23 DIAGNOSIS — Z01810 Encounter for preprocedural cardiovascular examination: Secondary | ICD-10-CM | POA: Diagnosis present

## 2023-09-23 DIAGNOSIS — C50912 Malignant neoplasm of unspecified site of left female breast: Secondary | ICD-10-CM

## 2023-09-23 DIAGNOSIS — Z01812 Encounter for preprocedural laboratory examination: Secondary | ICD-10-CM | POA: Diagnosis present

## 2023-09-23 HISTORY — DX: Other specified postprocedural states: R11.2

## 2023-09-23 HISTORY — DX: Essential (primary) hypertension: I10

## 2023-09-23 HISTORY — DX: Other specified postprocedural states: Z98.890

## 2023-09-23 LAB — BASIC METABOLIC PANEL
Anion gap: 7 (ref 5–15)
BUN: 15 mg/dL (ref 8–23)
CO2: 28 mmol/L (ref 22–32)
Calcium: 9.4 mg/dL (ref 8.9–10.3)
Chloride: 102 mmol/L (ref 98–111)
Creatinine, Ser: 0.79 mg/dL (ref 0.44–1.00)
GFR, Estimated: >60 mL/min (ref >60)
Glucose, Bld: 95 mg/dL (ref 70–99)
Potassium: 4.4 mmol/L (ref 3.5–5.1)
Sodium: 137 mmol/L (ref 135–145)

## 2023-09-23 LAB — CBC
HCT: 44.2 % (ref 36.0–46.0)
Hemoglobin: 14.8 g/dL (ref 12.0–15.0)
MCH: 31.8 pg (ref 26.0–34.0)
MCHC: 33.5 g/dL (ref 30.0–36.0)
MCV: 94.8 fL (ref 80.0–100.0)
Platelets: 175 10*3/uL (ref 150–400)
RBC: 4.66 MIL/uL (ref 3.87–5.11)
RDW: 11.9 % (ref 11.5–15.5)
WBC: 5.9 10*3/uL (ref 4.0–10.5)
nRBC: 0 % (ref 0.0–0.2)

## 2023-09-23 MED ORDER — GADOPICLENOL 0.5 MMOL/ML IV SOLN
7.0000 mL | Freq: Once | INTRAVENOUS | Status: AC | PRN
  Administered 2023-09-23: 7 mL via INTRAVENOUS

## 2023-09-23 NOTE — Progress Notes (Signed)
 PCP - Olam Pinal Cardiologist - denies  PPM/ICD - denies Device Orders - n/a Rep Notified - n/a  Chest x-ray - denies EKG - 09/23/23 Stress Test - denies ECHO - denies Cardiac Cath - denies  Sleep Study - denies CPAP - n/a  No DM  Last dose of GLP1 agonist-  n/a GLP1 instructions: n/a  Blood Thinner Instructions: n/a Aspirin Instructions: n/a     ERAS Protcol - clears until 0430  COVID TEST- n/a   Anesthesia review: no  Patient denies shortness of breath, fever, cough and chest pain at PAT appointment   All instructions explained to the patient, with a verbal understanding of the material. Patient agrees to go over the instructions while at home for a better understanding. Patient also instructed to self quarantine after being tested for COVID-19. The opportunity to ask questions was provided.

## 2023-09-23 NOTE — Progress Notes (Signed)
 New Breast Cancer Diagnosis: Left Axillary tail Breast mass    Histology per Pathology Report: grade 2, Invasive Ductal Carcinoma 08/24/2023  Receptor Status: ER(positive), PR (positive), Her2-neu (negative), Ki-(15%)   Surgeon and surgical plan, if any: Dr. Belinda 09/19/2023  -On physical examination, the palpable mass appears to be a axillary tail breast mass and does not seem consistent with a lymph node.  -We will confirm this with bilateral breast MRI Assuming that the mass represents the primary cancer in the axillary tail, recommend left axillary breast lumpectomy.  -The mass is easily palpable so there is no need for localization. -Lumpectomy unscheduled at this time.   Medical oncologist, treatment if any:   Dr. Loretha 09/26/2023   Family History of Breast/Ovarian/Prostate Cancer: Mom and Maternal Aunt had breast cancer.  Lymphedema issues, if any: No     Pain issues, if any: No    SAFETY ISSUES: Prior radiation? Right Breast, Done at [facility] Pacemaker/ICD? No Possible current pregnancy? Hysterectomy Is the patient on methotrexate? No  Current Complaints / other details:   Right Breast- Lumpectomy with radiation followed by tamoxifen 2005.

## 2023-09-26 ENCOUNTER — Other Ambulatory Visit: Payer: Self-pay | Admitting: Surgery

## 2023-09-26 ENCOUNTER — Telehealth: Payer: Self-pay | Admitting: Genetic Counselor

## 2023-09-26 ENCOUNTER — Inpatient Hospital Stay: Payer: Medicare Other

## 2023-09-26 ENCOUNTER — Encounter: Payer: Self-pay | Admitting: Hematology and Oncology

## 2023-09-26 ENCOUNTER — Encounter: Payer: Self-pay | Admitting: Genetic Counselor

## 2023-09-26 ENCOUNTER — Inpatient Hospital Stay: Payer: Medicare Other | Attending: Hematology and Oncology | Admitting: Hematology and Oncology

## 2023-09-26 VITALS — BP 152/68 | HR 69 | Temp 97.9°F | Resp 18 | Ht 62.01 in | Wt 165.8 lb

## 2023-09-26 DIAGNOSIS — Z170 Estrogen receptor positive status [ER+]: Secondary | ICD-10-CM | POA: Insufficient documentation

## 2023-09-26 DIAGNOSIS — Z853 Personal history of malignant neoplasm of breast: Secondary | ICD-10-CM

## 2023-09-26 DIAGNOSIS — C50912 Malignant neoplasm of unspecified site of left female breast: Secondary | ICD-10-CM

## 2023-09-26 DIAGNOSIS — C50612 Malignant neoplasm of axillary tail of left female breast: Secondary | ICD-10-CM | POA: Diagnosis not present

## 2023-09-26 DIAGNOSIS — B853 Phthiriasis: Secondary | ICD-10-CM | POA: Diagnosis not present

## 2023-09-26 DIAGNOSIS — C50919 Malignant neoplasm of unspecified site of unspecified female breast: Secondary | ICD-10-CM | POA: Diagnosis not present

## 2023-09-26 LAB — GENETIC SCREENING ORDER

## 2023-09-26 NOTE — Telephone Encounter (Signed)
 POC order was asked to be placed. Will order [employer] and enter TRF.  REsults will go into the s drive.  Dr. Al Pimple will refer patient for genetic counseling

## 2023-09-26 NOTE — Progress Notes (Signed)
 [facility] CONSULT NOTE  Patient Care Team: Cleotilde Planas, MD as PCP - General (Family Medicine)  CHIEF COMPLAINTS/PURPOSE OF CONSULTATION:  Newly diagnosed breast cancer  HISTORY OF PRESENTING ILLNESS:  Kara Pugh 83 y.o. female is here because of recent diagnosis of left breast  I reviewed her records extensively and collaborated the history with the patient.  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 The patient noticed a lump in the left axillary area a couple of months ago, which has remained unchanged. A mammogram and ultrasound were performed, and a biopsy of the axillary mass suggested breast cancer.  There was no evidence of residual nodal tissue in this biopsy.  Prognostics from this showed ER +95% strong staining, PR +95% strong staining HER2 1+ Ki-67 of 15%.  She then had MRI breast which showed a couple areas of suspicion pending ultrasound-guided biopsy.  The patient's previous breast cancer was treated with a lumpectomy, radiation, and two years of tamoxifen approximately 35 years ago. The patient has a family history of breast cancer, with both her mother and her mother's sister having had the disease. She is otherwise in great health, stays active.  All her medical comorbidities are well-regulated.  She had 2 children, she was 25 at the time of first childbirth, used birth control for several years, no use of hormone replacement therapy.  No history of breast-feeding.  She has never had genetic testing before.  Rest of the pertinent 10 point ROS reviewed and negative  MEDICAL HISTORY:  Past Medical History:  Diagnosis Date   Anemia    When she was younger   Arthritis    Asthma    Breast cancer (HCC) 1990   Left Breast Cancer   Hepatitis    Hx Hep B   High cholesterol    Hypertension    Hypothyroidism    Personal history of radiation therapy 1990   Pneumonia    Summer 2023    PONV (postoperative nausea and vomiting)    Thyroid  disease     SURGICAL HISTORY: Past Surgical History:  Procedure Laterality Date   ABDOMINAL HYSTERECTOMY     Total   BREAST BIOPSY Right 10/05/2022   US  RT BREAST BX W LOC DEV 1ST LESION IMG BX SPEC US  GUIDE 10/05/2022 [facility]   BREAST BIOPSY  11/09/2022   MM RT RADIOACTIVE SEED LOC MAMMO GUIDE 11/09/2022 [facility]   BREAST BIOPSY Right 01/11/2017   BREAST LUMPECTOMY Left 1990   CATARACT EXTRACTION W/ INTRAOCULAR LENS IMPLANT Bilateral    COLONOSCOPY     RADIOACTIVE SEED GUIDED EXCISIONAL BREAST BIOPSY Right 11/10/2022   Procedure: RADIOACTIVE SEED GUIDED EXCISIONAL RIGHT BREAST BIOPSY;  Surgeon: Belinda Cough, MD;  Location: [facility] OR;  Service: General;  Laterality: Right;   THYROIDECTOMY, PARTIAL  1973   TONSILLECTOMY     As a child    SOCIAL HISTORY: Social History   Socioeconomic History   Marital status: Married    Spouse name: Not on file   Number of children: Not on file   Years of education: Not on file   Highest education level: Not on file  Occupational History   Not on file  Tobacco Use   Smoking status: Never   Smokeless tobacco: Not on file  Substance and Sexual Activity   Alcohol use: Yes    Comment: Occasionally   Drug use: No   Sexual activity: Not Currently  Other Topics Concern   Not on file  Social History Narrative   Not on file   Social Drivers of Health   Financial Resource Strain: Not on file  Food Insecurity: Not on file  Transportation Needs: Not on file  Physical Activity: Not on file  Stress: Not on file  Social Connections: Unknown (01/16/2022)   [facility]   Social Network    Social Network: Not on file  Intimate Partner Violence: Unknown (12/18/2021)   Received from [facility], [facility]   HITS    Physically Hurt: Not on file    Insult or Talk Down To: Not on file    Threaten Physical Harm: Not on file    Scream or Curse:  Not on file    FAMILY HISTORY: Family History  Problem Relation Age of Onset   Breast cancer Mother    Breast cancer Maternal Aunt     ALLERGIES:  is allergic to latex, codeine, and sulfa antibiotics.  MEDICATIONS:  Current Outpatient Medications  Medication Sig Dispense Refill   Cholecalciferol (VITAMIN D) 50 MCG (2000 UT) tablet Take 2,000 Units by mouth daily.     Coenzyme Q10 (CO Q-10) 100 MG CAPS Take 100 mg by mouth daily.     levothyroxine (SYNTHROID) 112 MCG tablet Take 112 mcg by mouth daily before breakfast.     loratadine (CLARITIN) 10 MG tablet Take 10 mg by mouth daily.     losartan (COZAAR) 25 MG tablet Take 25 mg by mouth daily.     Multiple Vitamin (MULTIVITAMIN WITH MINERALS) TABS tablet Take 1 tablet by mouth daily.     simvastatin (ZOCOR) 20 MG tablet Take 20 mg by mouth daily.     No current facility-administered medications for this visit.    REVIEW OF SYSTEMS:   Constitutional: Denies fevers, chills or abnormal night sweats Eyes: Denies blurriness of vision, double vision or watery eyes Ears, nose, mouth, throat, and face: Denies mucositis or sore throat Respiratory: Denies cough, dyspnea or wheezes Cardiovascular: Denies palpitation, chest discomfort or lower extremity swelling Gastrointestinal:  Denies nausea, heartburn or change in bowel habits Skin: Denies abnormal skin rashes Lymphatics: Denies new lymphadenopathy or easy bruising Neurological:Denies numbness, tingling or new weaknesses Behavioral/Psych: Mood is stable, no new changes  Breast: Denies any palpable lumps or discharge All other systems were reviewed with the patient and are negative.  PHYSICAL EXAMINATION: ECOG PERFORMANCE STATUS: 0 - Asymptomatic  Vitals:   09/26/23 1348  BP: (!) 152/68  Pulse: 69  Resp: 18  Temp: 97.9 F (36.6 C)  SpO2: 97%   Filed Weights   09/26/23 1348  Weight: 165 lb 12.8 oz (75.2 kg)    GENERAL:alert, no distress and comfortable BREAST: No  palpable masses in the breast.  There is a palpable left axillary mass, mobile, not matted.  No other regional adenopathy.  LABORATORY DATA:  I have reviewed the data as listed Lab Results  Component Value Date   WBC 5.9 09/23/2023   HGB 14.8 09/23/2023   HCT 44.2 09/23/2023   MCV 94.8 09/23/2023   PLT 175 09/23/2023   Lab Results  Component Value Date   NA 137 09/23/2023   K 4.4 09/23/2023   CL 102 09/23/2023   CO2 28 09/23/2023    RADIOGRAPHIC STUDIES: I have personally reviewed the radiological reports and agreed with the findings in the report.  ASSESSMENT AND PLAN:   This is a very pleasant 83 year old postmenopausal female patient  with prior history of left-sided breast cancer about 35 years ago, had lumpectomy adjuvant radiation and 2 years of tamoxifen now presents with a left axillary mass that was self palpated about 2 years ago.  She had further investigation and is here to review recommendations from medical oncology.  Breast Cancer Biopsy of axillary lymph node suggestive of breast cancer, likely originating from the axillary tail vs completely effaced LN from breast primary. Estrogen and progesterone positive, HER2 negative. MRI identified two additional areas of concern. Patient has a history of breast cancer treated with lumpectomy, radiation, and tamoxifen approximately 35 years ago. -Additional ultrasound to further evaluate areas of concern identified on MRI. -If ultrasound is inconclusive, consider MRI-guided biopsy. -Plan for surgical intervention pending further imaging results. -Post-surgery, re-evaluate for potential radiation therapy and antiestrogen therapy (tamoxifen or anastrozole). -Avoid chemotherapy due to patient's age and potential minimal benefit given strong ER/PR positive, intermediate grade tumor.  Genetic Predisposition Patient has a family history of breast cancer (mother and maternal aunt). Previous breast cancer and current diagnosis raise  concern for genetic predisposition. -She agreed to general testing, ambulatory referral to genetics placed and genetic testing ordered.  Follow-up in 4-6 weeks post-surgery to evaluate surgical outcomes and discuss further treatment plans.  All questions were answered. The patient knows to call the clinic with any problems, questions or concerns.    Amber Stalls, MD 09/26/23

## 2023-09-26 NOTE — Progress Notes (Signed)
 Radiation Oncology         ([phone]) [phone] ________________________________  Initial Outpatient Consultation - Conducted via telephone at patient request.  I spoke with the patient to conduct this consult visit via telephone. The patient was notified in advance and was offered an in person or telemedicine meeting to allow for face to face communication but instead preferred to proceed with a telephone consult.     Name: Kara Pugh        MRN: [id number]  Date of Service: 09/27/2023 DOB: 1941/07/29  RR:Fpoozm, Olam, MD  Belinda Cough, MD     REFERRING PHYSICIAN: Belinda Cough, MD   DIAGNOSIS: There were no encounter diagnoses.   HISTORY OF PRESENT ILLNESS: Kara Pugh is a 83 y.o. female seen at the request of Dr. Belinda for a newly diagnosed left breast cancer. The patient has a history of ER positive early stage right breast cancer treated over 20 years ago with right lumpectomy, and adjuvant radiotherapy and antiestrogen therapy. She developed a hamartoma of the right breast treated surgically in 2018, and then in 2024 a complex sclerosing lesion also treated with lumpectomy in 2024 with no evidence of malignancy.   More recently however, she had a palpable mass in her left axilla.  This was noted by ultrasound and further diagnostic workup on 08/23/2023 to measure up to 1.9 cm.  No other mammographic findings in either breast or other lymph nodes were appreciated.  She underwent a biopsy on 08/24/2023 that showed ductal carcinoma with solid and papillary features, the tumor was ER/PR positive, HER2 negative with a Ki-67 of 15%.  She underwent an MRI scan for further clarity of her disease, this showed a 8 mm mass in the lateral posterior left breast and the biopsy-proven left axillary site was only partially visualized but measured 1.6 cm.  There was also an 8 mm subtle oval mass in the central posterior slightly medial left breast.  No abnormalities were noted in the right.  She is  awaiting recommendations from her MRI with Dr. Belinda. She met yesterday with Dr. Loretha. Today she's contacted by phone to review the rationale for adjuvant radiotherapy at the appropriate time.    PREVIOUS RADIATION THERAPY: Yes   The right breast was treated with adjuvant radiation > 20 years ago. No records of this treatment, and patient is unsure of the number of treatments   PAST MEDICAL HISTORY:  Past Medical History:  Diagnosis Date   Anemia    When she was younger   Arthritis    Asthma    Breast cancer (HCC) 1990   Left Breast Cancer   Hepatitis    Hx Hep B   High cholesterol    Hypertension    Hypothyroidism    Personal history of radiation therapy 1990   Pneumonia    Summer 2023   PONV (postoperative nausea and vomiting)    Thyroid  disease        PAST SURGICAL HISTORY: Past Surgical History:  Procedure Laterality Date   ABDOMINAL HYSTERECTOMY     Total   BREAST BIOPSY Right 10/05/2022   US  RT BREAST BX W LOC DEV 1ST LESION IMG BX SPEC US  GUIDE 10/05/2022 [facility]   BREAST BIOPSY  11/09/2022   MM RT RADIOACTIVE SEED LOC MAMMO GUIDE 11/09/2022 [facility]   BREAST BIOPSY Right 01/11/2017   BREAST LUMPECTOMY Left 1990   CATARACT EXTRACTION W/ INTRAOCULAR LENS IMPLANT Bilateral    COLONOSCOPY     RADIOACTIVE  SEED GUIDED EXCISIONAL BREAST BIOPSY Right 11/10/2022   Procedure: RADIOACTIVE SEED GUIDED EXCISIONAL RIGHT BREAST BIOPSY;  Surgeon: Belinda Cough, MD;  Location: [facility] OR;  Service: General;  Laterality: Right;   THYROIDECTOMY, PARTIAL  1973   TONSILLECTOMY     As a child     FAMILY HISTORY:  Family History  Problem Relation Age of Onset   Breast cancer Mother    Breast cancer Maternal Aunt      SOCIAL HISTORY:  reports that she has never smoked. She does not have any smokeless tobacco history on file. She reports current alcohol use. She reports that she does not use drugs. The patient is married and lives in [place].      ALLERGIES: Latex, Codeine, and Sulfa antibiotics   MEDICATIONS:  Current Outpatient Medications  Medication Sig Dispense Refill   Cholecalciferol (VITAMIN D) 50 MCG (2000 UT) tablet Take 2,000 Units by mouth daily.     Coenzyme Q10 (CO Q-10) 100 MG CAPS Take 100 mg by mouth daily.     levothyroxine (SYNTHROID) 112 MCG tablet Take 112 mcg by mouth daily before breakfast.     loratadine (CLARITIN) 10 MG tablet Take 10 mg by mouth daily.     losartan (COZAAR) 25 MG tablet Take 25 mg by mouth daily.     Multiple Vitamin (MULTIVITAMIN WITH MINERALS) TABS tablet Take 1 tablet by mouth daily.     simvastatin (ZOCOR) 20 MG tablet Take 20 mg by mouth daily.     No current facility-administered medications for this visit.     REVIEW OF SYSTEMS: On review of systems, the patient reports that she is doing well. A complete review of systems was reviewed with the patient and pertinent positives findings are noted in the history of present illness.      PHYSICAL EXAM:  Unable to assess due to encounter type    ECOG = 0  0 - Asymptomatic (Fully active, able to carry on all predisease activities without restriction)  1 - Symptomatic but completely ambulatory (Restricted in physically strenuous activity but ambulatory and able to carry out work of a light or sedentary nature. For example, light housework, office work)  2 - Symptomatic, <50% in bed during the day (Ambulatory and capable of all self care but unable to carry out any work activities. Up and about more than 50% of waking hours)  3 - Symptomatic, >50% in bed, but not bedbound (Capable of only limited self-care, confined to bed or chair 50% or more of waking hours)  4 - Bedbound (Completely disabled. Cannot carry on any self-care. Totally confined to bed or chair)  5 - Death   Raylene MM, Creech RH, Tormey DC, et al. [phone]). Toxicity and response criteria of the [facility]. Am. DOROTHA Bridges. Oncol. 5 (6):  649-55    LABORATORY DATA:  Lab Results  Component Value Date   WBC 5.9 09/23/2023   HGB 14.8 09/23/2023   HCT 44.2 09/23/2023   MCV 94.8 09/23/2023   PLT 175 09/23/2023   Lab Results  Component Value Date   NA 137 09/23/2023   K 4.4 09/23/2023   CL 102 09/23/2023   CO2 28 09/23/2023   Lab Results  Component Value Date   ALT 30 11/05/2022   AST 40 11/05/2022   ALKPHOS 65 11/05/2022   BILITOT 1.0 11/05/2022      RADIOGRAPHY: MR BREAST BILATERAL W WO CONTRAST INC CAD Result Date: 09/23/2023 CLINICAL DATA:  83 year old female  who presented 08/23/2023 with a palpable lump in the left axilla. Ultrasound-guided biopsy of a mass demonstrated grade 2 ductal carcinoma with solid and papillary features within the left axilla. No definitive nodal tissue was found. MRI is to evaluate for an occult cancer within the breast the patient has remote history of left breast cancer status post lumpectomy in the early 1990s. She has had excisional biopsy of the retroareolar right breast in February of 2024 with final pathology demonstrating complex sclerosing lesion and sclerosed papilloma. No malignancy was found in the right breast. EXAM: BILATERAL BREAST MRI WITH AND WITHOUT CONTRAST TECHNIQUE: Multiplanar, multisequence MR images of both breasts were obtained prior to and following the intravenous administration of 7 ml of Vueway . Three-dimensional MR images were rendered by post-processing of the original MR data on an independent workstation. The three-dimensional MR images were interpreted, and findings are reported in the following complete MRI report for this study. Three dimensional images were evaluated at the independent interpreting workstation using the DynaCAD thin client. COMPARISON:  No prior MRI available for comparison. Correlation made with prior mammogram and ultrasound images. FINDINGS: Breast composition: b. Scattered fibroglandular tissue. Background parenchymal enhancement: Mild  Right breast: Non mass enhancement in the retroareolar right breast is compatible with history of surgical excisional biopsy for a complex sclerosing lesion and sclerosing papilloma. Left breast: In the lateral posterior left breast (series 6, image 67), there is an enhancing oval mass measuring 0.8 cm. No susceptibility artifact is seen within the mass, and this likely does not represent the site previously biopsied. This mass is T2 bright, and therefore could also represent a lymph node. In the central to posterior slightly medial left breast there is a 0.8 cm oval enhancing mass (series 12, image 71). Lymph nodes: The biopsy-proven malignancy cannot be seen well on the post-contrast images. This likely corresponds with the slightly T2 bright partially visualized mass in the left axilla (series 2, image 2) and more subtly in the postcontrast non subtracted images (series 4, image 2). The visualized portion of the mass measures 1.6 cm. No other abnormal lymph nodes are visualized in either axilla. Ancillary findings:  None. IMPRESSION: 1. There is a 0.8 cm mass in the lateral posterior left breast. This could represent a primary breast cancer, but also represent a lymph node. 2. The biopsy proven site of cancer in the left axilla is only partially visualized on this study, but measures approximately 1.6 cm. 3. There is a subtle oval mass in the central posterior slightly medial left breast measuring 0.8 cm. 4.  No evidence of malignancy in the right breast. RECOMMENDATION: 1. Targeted ultrasound to the lateral left breast is recommended for evaluation of the 0.8 cm indeterminate mass. If this is identified and abnormal in appearance, ultrasound-guided biopsy would be recommended. 2. MRI guided biopsy of the oval mass in the central to slightly medial left breast may be performed if clinically relevant following ultrasound and possible ultrasound-guided biopsy. If the mass in the lateral left breast is a benign  appearing lymph node and no biopsy is performed, or if pathology of the mass is benign after biopsy, then MRI guided biopsy of this mass is recommended. BI-RADS CATEGORY  4: Suspicious. Electronically Signed   By: Rosaline Collet M.D.   On: 09/23/2023 14:55       IMPRESSION/PLAN: 1. [id number], grade 2, ER/PR positive invasive ductal carcinoma of the left breast. Today we reviewed the patient's pathology findings and the nature of node positive breast  disease. Her course for further work up likely includes additional left breast biopsies based on her MRI. This will be discussed further by Dr. Belinda. Systemic therapy is not anticipated. Dr. Loretha anticipates adjuvant antiestrogen therapy to follow.  Given her history, I am hopeful that she would not need radiation treatment.  I discussed that with her today.  However, since her previous treatment was so long ago, we would have some flexibility there and I believe could likely give radiation treatment safely if there was a substantial benefit based on her upcoming information from surgery.  She is eager to proceed further with the workup and knows that we will make a final decision regarding radiation treatment postoperatively. 2. Remote history of ER positive right breast cancer. This will be followed expectantly as she proceeds with treatment for #1.    This encounter was conducted via telephone.  The patient has provided two factor identification and has given verbal consent for this type of encounter and has been advised to only accept a meeting of this type in a secure network environment. The time spent during this encounter was 45 minutes including preparation, discussion, and coordination of the patient's care. The attendants for this meeting include Sharene Cary, RN, Dr. Dewey and Jamee LITTIE Daring.  During the encounter,  Sharene Cary, RN, Dr. Dewey, were located at [facility] Radiation Oncology Department.  Jamee LITTIE Daring was  located at home.    ________________________________   Norleen CANDIE Dewey, MD, PhD     **Disclaimer: This note was dictated with voice recognition software. Similar sounding words can inadvertently be transcribed and this note may contain transcription errors which may not have been corrected upon publication of note.**

## 2023-09-26 NOTE — Telephone Encounter (Signed)
 Scheduled appointments per scheduling message. Patient is aware of the made appointments and will be mailed an appointment reminder.

## 2023-09-27 ENCOUNTER — Ambulatory Visit
Admission: RE | Admit: 2023-09-27 | Discharge: 2023-09-27 | Disposition: A | Discharge status: Home / Self Care | Payer: Medicare Other | Source: Ambulatory Visit | Attending: Radiation Oncology | Admitting: Radiation Oncology

## 2023-09-27 ENCOUNTER — Encounter: Payer: Self-pay | Admitting: *Deleted

## 2023-09-27 ENCOUNTER — Encounter: Payer: Self-pay | Admitting: Radiation Oncology

## 2023-09-27 VITALS — Ht 62.0 in | Wt 165.0 lb

## 2023-09-27 DIAGNOSIS — C50612 Malignant neoplasm of axillary tail of left female breast: Secondary | ICD-10-CM

## 2023-09-27 DIAGNOSIS — Z170 Estrogen receptor positive status [ER+]: Secondary | ICD-10-CM

## 2023-09-28 ENCOUNTER — Other Ambulatory Visit: Payer: Self-pay | Admitting: Hematology and Oncology

## 2023-09-28 DIAGNOSIS — Z170 Estrogen receptor positive status [ER+]: Secondary | ICD-10-CM

## 2023-09-28 DIAGNOSIS — C50612 Malignant neoplasm of axillary tail of left female breast: Secondary | ICD-10-CM | POA: Diagnosis not present

## 2023-09-29 ENCOUNTER — Ambulatory Visit (HOSPITAL_COMMUNITY): Admission: RE | Admit: 2023-09-29 | Payer: Medicare Other | Source: Ambulatory Visit | Admitting: Surgery

## 2023-09-29 ENCOUNTER — Ambulatory Visit
Admission: RE | Admit: 2023-09-29 | Discharge: 2023-09-29 | Disposition: A | Discharge status: Home / Self Care | Payer: Medicare Other | Source: Ambulatory Visit | Attending: Hematology and Oncology | Admitting: Hematology and Oncology

## 2023-09-29 ENCOUNTER — Ambulatory Visit
Admission: RE | Admit: 2023-09-29 | Discharge: 2023-09-29 | Discharge status: Home / Self Care | Payer: Medicare Other | Source: Ambulatory Visit | Attending: Surgery | Admitting: Surgery

## 2023-09-29 ENCOUNTER — Encounter: Payer: Self-pay | Admitting: *Deleted

## 2023-09-29 ENCOUNTER — Other Ambulatory Visit: Payer: Self-pay | Admitting: Hematology and Oncology

## 2023-09-29 ENCOUNTER — Encounter (HOSPITAL_COMMUNITY): Admission: RE | Payer: Self-pay | Source: Ambulatory Visit

## 2023-09-29 DIAGNOSIS — C50912 Malignant neoplasm of unspecified site of left female breast: Secondary | ICD-10-CM

## 2023-09-29 DIAGNOSIS — C50612 Malignant neoplasm of axillary tail of left female breast: Secondary | ICD-10-CM

## 2023-09-29 DIAGNOSIS — N649 Disorder of breast, unspecified: Secondary | ICD-10-CM | POA: Diagnosis not present

## 2023-09-29 DIAGNOSIS — N6325 Unspecified lump in the left breast, overlapping quadrants: Secondary | ICD-10-CM | POA: Diagnosis not present

## 2023-09-29 DIAGNOSIS — D0512 Intraductal carcinoma in situ of left breast: Secondary | ICD-10-CM | POA: Diagnosis not present

## 2023-09-29 HISTORY — PX: BREAST BIOPSY: SHX20

## 2023-09-29 SURGERY — LYMPHADENECTOMY, AXILLARY
Anesthesia: General | Laterality: Left

## 2023-09-30 LAB — SURGICAL PATHOLOGY

## 2023-10-03 ENCOUNTER — Encounter: Payer: Self-pay | Admitting: *Deleted

## 2023-10-06 ENCOUNTER — Ambulatory Visit
Admission: RE | Admit: 2023-10-06 | Discharge: 2023-10-06 | Disposition: A | Discharge status: Home / Self Care | Payer: Medicare Other | Source: Ambulatory Visit | Attending: Hematology and Oncology | Admitting: Hematology and Oncology

## 2023-10-06 DIAGNOSIS — N6012 Diffuse cystic mastopathy of left breast: Secondary | ICD-10-CM | POA: Diagnosis not present

## 2023-10-06 DIAGNOSIS — N6325 Unspecified lump in the left breast, overlapping quadrants: Secondary | ICD-10-CM | POA: Diagnosis not present

## 2023-10-06 DIAGNOSIS — C50612 Malignant neoplasm of axillary tail of left female breast: Secondary | ICD-10-CM

## 2023-10-06 DIAGNOSIS — C50912 Malignant neoplasm of unspecified site of left female breast: Secondary | ICD-10-CM | POA: Diagnosis not present

## 2023-10-07 ENCOUNTER — Encounter: Payer: Self-pay | Admitting: *Deleted

## 2023-10-07 LAB — SURGICAL PATHOLOGY

## 2023-10-10 ENCOUNTER — Ambulatory Visit: Payer: Self-pay | Admitting: Surgery

## 2023-10-10 ENCOUNTER — Encounter: Payer: Self-pay | Admitting: Genetic Counselor

## 2023-10-10 DIAGNOSIS — Z1379 Encounter for other screening for genetic and chromosomal anomalies: Secondary | ICD-10-CM | POA: Insufficient documentation

## 2023-10-12 ENCOUNTER — Encounter (HOSPITAL_BASED_OUTPATIENT_CLINIC_OR_DEPARTMENT_OTHER): Payer: Self-pay | Admitting: Surgery

## 2023-10-12 ENCOUNTER — Other Ambulatory Visit: Payer: Self-pay

## 2023-10-12 ENCOUNTER — Inpatient Hospital Stay: Payer: Medicare Other | Admitting: Genetic Counselor

## 2023-10-12 DIAGNOSIS — C50612 Malignant neoplasm of axillary tail of left female breast: Secondary | ICD-10-CM

## 2023-10-12 DIAGNOSIS — Z1379 Encounter for other screening for genetic and chromosomal anomalies: Secondary | ICD-10-CM

## 2023-10-12 DIAGNOSIS — Z170 Estrogen receptor positive status [ER+]: Secondary | ICD-10-CM | POA: Diagnosis not present

## 2023-10-12 DIAGNOSIS — Z803 Family history of malignant neoplasm of breast: Secondary | ICD-10-CM | POA: Diagnosis not present

## 2023-10-12 DIAGNOSIS — Z853 Personal history of malignant neoplasm of breast: Secondary | ICD-10-CM | POA: Diagnosis not present

## 2023-10-12 NOTE — Progress Notes (Signed)
REFERRING PROVIDER: Rachel Moulds, MD [address] [place],  [place] [address]  PRIMARY PROVIDER:  Sigmund Hazel, MD  PRIMARY REASON FOR VISIT:  Encounter Diagnoses  Name Primary?   Genetic testing Yes   Malignant neoplasm of axillary tail of left breast in female, estrogen receptor positive (HCC)    Family history of breast cancer    Personal history of breast cancer    HISTORY OF PRESENT ILLNESS:   Ms. Cornick, a 83 y.o. female, was seen for a [place] cancer genetics consultation at the request of Dr. Al Pimple due to a personal and family history of breast cancer.  Ms. Ditto presents to clinic today to discuss the possibility of a hereditary predisposition to cancer, to discuss genetic testing, and to further clarify her future cancer risks, as well as potential cancer risks for family members.   In December 2024, at the age of 59, Ms. Reh had a biopsy of axillary lymph node which showed ductal carcinoma (ER+/PR+/HER2-).  The treatment plan is pending. Around 35 years prior, around the age of 40, Ms. Fini was diagnosed with breast cancer treated with lumpectomy, radiation, and 2 years of tamoxifen.    CANCER HISTORY:  Oncology History  Breast cancer (HCC)  09/26/2023 Initial Diagnosis   Breast cancer (HCC)   10/05/2023 Genetic Testing   Negative genetic testing on the CancerNext+RNAinsight panel.  The report date is October 05, 2023.  The [facility] CancerNext+RNAinsight Panel includes sequencing, rearrangement analysis, and RNA analysis for the following 39 genes: APC, ATM, BAP1, BARD1, BMPR1A, BRCA1, BRCA2, BRIP1, CDH1, CDKN2A, CHEK2, FH, FLCN, MET, MLH1, MSH2, MSH6, MUTYH, NF1, NTHL1, PALB2, PMS2, PTEN, RAD51C, RAD51D, SMAD4, STK11, TP53, TSC1, TSC2, and VHL (sequencing and deletion/duplication); AXIN2, HOXB13, MBD4, MSH3, POLD1 and POLE (sequencing only); EPCAM and GREM1 (deletion/duplication only).      Past Medical History:  Diagnosis Date   Anemia    When she was younger    Arthritis    Asthma    Breast cancer (HCC) 1990   Left Breast Cancer   Hepatitis    Hx Hep B   High cholesterol    Hypertension    Hypothyroidism    Personal history of radiation therapy 1990   Pneumonia    Summer 2023   PONV (postoperative nausea and vomiting)    Thyroid disease     Past Surgical History:  Procedure Laterality Date   ABDOMINAL HYSTERECTOMY     Total   BREAST BIOPSY Right 10/05/2022   [place] RT BREAST BX W LOC DEV 1ST LESION IMG BX SPEC US GUIDE 10/05/2022 [facility]   BREAST BIOPSY  11/09/2022   MM RT RADIOACTIVE SEED LOC MAMMO GUIDE 11/09/2022 [facility]   BREAST BIOPSY Right 01/11/2017   BREAST BIOPSY Left 09/29/2023   [place] LT BREAST BX W LOC DEV 1ST LESION IMG BX SPEC US GUIDE 09/29/2023 [facility]   BREAST LUMPECTOMY Left 1990   CATARACT EXTRACTION W/ INTRAOCULAR LENS IMPLANT Bilateral    COLONOSCOPY     RADIOACTIVE SEED GUIDED EXCISIONAL BREAST BIOPSY Right 11/10/2022   Procedure: RADIOACTIVE SEED GUIDED EXCISIONAL RIGHT BREAST BIOPSY;  Surgeon: Manus Rudd, MD;  Location: [facility] OR;  Service: General;  Laterality: Right;   THYROIDECTOMY, PARTIAL  1973   TONSILLECTOMY     As a child    FAMILY HISTORY:  We obtained a detailed, 4-generation family history.  Significant diagnoses are listed below: Family History  Problem Relation Age of Onset   Breast cancer Mother  dx 32s   Breast cancer Maternal Aunt        dx 60s-70s   Breast cancer Maternal Aunt        dx >50   Uterine cancer Maternal Aunt        dx > 50   Uterine cancer Cousin 50       d. 50     Ms. Bartnick is unawre of previous family history of genetic testing for hereditary cancer risks. There is no reported Ashkenazi Jewish ancestry. There is no known consanguinity.   GENETIC TEST RESULTS:  The [facility] CancerNext-+RNAinsight Panel found no pathogenic mutations.   The [facility] CancerNext+RNAinsight Panel includes sequencing, rearrangement analysis, and RNA analysis  for the following 39 genes: APC, ATM, BAP1, BARD1, BMPR1A, BRCA1, BRCA2, BRIP1, CDH1, CDKN2A, CHEK2, FH, FLCN, MET, MLH1, MSH2, MSH6, MUTYH, NF1, NTHL1, PALB2, PMS2, PTEN, RAD51C, RAD51D, SMAD4, STK11, TP53, TSC1, TSC2, and VHL (sequencing and deletion/duplication); AXIN2, HOXB13, MBD4, MSH3, POLD1 and POLE (sequencing only); EPCAM and GREM1 (deletion/duplication only).   The test report has been scanned into EPIC and is located under the Molecular Pathology section of the Results Review tab.  A portion of the result report is included below for reference. Genetic testing reported out on October 05, 2023.      Even though a pathogenic variant was not identified, possible explanations for the cancer in the family may include: There may be no hereditary risk for cancer in the family. The cancers in Ms. Balcerzak and/or her family may be sporadic/familial or due to other genetic and environmental factors.  Most cancer is not hereditary.  There may be a gene mutation in one of these genes that current testing methods cannot detect but that chance is small. There could be another gene that has not yet been discovered, or that we have not yet tested, that is responsible for the cancer diagnoses in the family.  It is also possible there is a hereditary cause for the cancer in the family that Ms. Kirtley did not inherit.   Therefore, it is important to remain in touch with cancer genetics in the future so that we can continue to offer Ms. Imhoff the most up to date genetic testing.   ADDITIONAL GENETIC TESTING:   Ms. Wigglesworth genetic testing was fairly extensive.  If there are additional relevant genes identified to increase cancer risk that can be analyzed in the future, we would be happy to discuss and coordinate this testing at that time.     CANCER SCREENING RECOMMENDATIONS:  Ms. Penagos test result is considered negative (normal).  This means that we have not identified a hereditary cause for her personal  history of breast cancer at this time.   An individual's cancer risk and medical management are not determined by genetic test results alone. Overall cancer risk assessment incorporates additional factors, including personal medical history, family history, and any available genetic information that may result in a personalized plan for cancer prevention and surveillance. Therefore, it is recommended she continue to follow the cancer management and screening guidelines provided by her oncology and primary healthcare provider.    RECOMMENDATIONS FOR FAMILY MEMBERS:   Since she did not inherit a identifiable mutation in a cancer predisposition gene included on this panel, her children could not have inherited a known mutation from her in one of these genes. Individuals in this family might be at some increased risk of developing cancer, over the general population risk, due to the family history  of cancer.  Individuals in the family should notify their providers of the family history of cancer. We recommend women in this family have a yearly mammogram beginning at age 62, or 58 years younger than the earliest onset of cancer, an annual clinical breast exam, and perform monthly breast self-exams.  Risk models that take into account family history and hormonal history may be helpful in determining appropriate breast cancer screening options for family members.  Other members of the family may still carry a pathogenic variant in one of these genes that Ms. Sanville did not inherit. Based on the family history, we recommend first degree relatives of her mother, who was diagnosed with breast cancer in her 38s, as well as relatives more closely related to her cousin with uterine cancer at age 61 have genetic counseling and testing. Ms. Quant can let us know if we can be of any assistance in coordinating genetic counseling and/or testing for these family members.     FOLLOW-UP:  Cancer genetics is a rapidly  advancing field and it is possible that new genetic tests will be appropriate for her and/or her family members in the future. We encourage Ms. Willcox to remain in contact with cancer genetics, so we can update her personal and family histories and let her know of advances in cancer genetics that may benefit this family.   Our contact number was provided.  They are welcome to call us at anytime with additional questions or concerns.   Ruweyda Macknight M. Rennie Plowman, MS, [facility] Genetic Counselor [email or web address] (P) [phone]   20 minutes were spent on the date of the encounter in service to the patient including preparation, face-to-face consultation, documentation and care coordination.  The patient was seen alone.  Drs. Gunnar Bulla and/or Mosetta Putt were available to discuss this case as needed.    _______________________________________________________________________ For Office Staff:  Number of people involved in session: 2 Was an Intern/ student involved with case: no

## 2023-10-14 ENCOUNTER — Encounter: Payer: Self-pay | Admitting: Genetic Counselor

## 2023-10-17 ENCOUNTER — Encounter: Payer: Self-pay | Admitting: *Deleted

## 2023-10-18 ENCOUNTER — Encounter (HOSPITAL_BASED_OUTPATIENT_CLINIC_OR_DEPARTMENT_OTHER)
Admission: RE | Disposition: A | Discharge status: Home / Self Care | Payer: Self-pay | Source: Home / Self Care | Attending: Surgery

## 2023-10-18 ENCOUNTER — Other Ambulatory Visit: Payer: Self-pay

## 2023-10-18 ENCOUNTER — Ambulatory Visit (HOSPITAL_BASED_OUTPATIENT_CLINIC_OR_DEPARTMENT_OTHER): Payer: Medicare Other | Admitting: Anesthesiology

## 2023-10-18 ENCOUNTER — Ambulatory Visit (HOSPITAL_BASED_OUTPATIENT_CLINIC_OR_DEPARTMENT_OTHER)
Admission: RE | Admit: 2023-10-18 | Discharge: 2023-10-18 | Disposition: A | Discharge status: Home / Self Care | Payer: Medicare Other | Attending: Surgery | Admitting: Surgery

## 2023-10-18 ENCOUNTER — Encounter (HOSPITAL_BASED_OUTPATIENT_CLINIC_OR_DEPARTMENT_OTHER): Payer: Self-pay | Admitting: Surgery

## 2023-10-18 DIAGNOSIS — N6489 Other specified disorders of breast: Secondary | ICD-10-CM | POA: Diagnosis not present

## 2023-10-18 DIAGNOSIS — E039 Hypothyroidism, unspecified: Secondary | ICD-10-CM | POA: Insufficient documentation

## 2023-10-18 DIAGNOSIS — I10 Essential (primary) hypertension: Secondary | ICD-10-CM | POA: Insufficient documentation

## 2023-10-18 DIAGNOSIS — Z79890 Hormone replacement therapy (postmenopausal): Secondary | ICD-10-CM | POA: Diagnosis not present

## 2023-10-18 DIAGNOSIS — C50612 Malignant neoplasm of axillary tail of left female breast: Secondary | ICD-10-CM | POA: Insufficient documentation

## 2023-10-18 DIAGNOSIS — Q859 Phakomatosis, unspecified: Secondary | ICD-10-CM | POA: Insufficient documentation

## 2023-10-18 DIAGNOSIS — Z01818 Encounter for other preprocedural examination: Secondary | ICD-10-CM

## 2023-10-18 DIAGNOSIS — Z79899 Other long term (current) drug therapy: Secondary | ICD-10-CM | POA: Insufficient documentation

## 2023-10-18 DIAGNOSIS — Z170 Estrogen receptor positive status [ER+]: Secondary | ICD-10-CM | POA: Insufficient documentation

## 2023-10-18 DIAGNOSIS — J45909 Unspecified asthma, uncomplicated: Secondary | ICD-10-CM | POA: Insufficient documentation

## 2023-10-18 DIAGNOSIS — Z1721 Progesterone receptor positive status: Secondary | ICD-10-CM | POA: Insufficient documentation

## 2023-10-18 DIAGNOSIS — N6342 Unspecified lump in left breast, subareolar: Secondary | ICD-10-CM | POA: Insufficient documentation

## 2023-10-18 DIAGNOSIS — N6341 Unspecified lump in right breast, subareolar: Secondary | ICD-10-CM | POA: Diagnosis not present

## 2023-10-18 DIAGNOSIS — Z1732 Human epidermal growth factor receptor 2 negative status: Secondary | ICD-10-CM | POA: Diagnosis not present

## 2023-10-18 DIAGNOSIS — D098 Carcinoma in situ of other specified sites: Secondary | ICD-10-CM | POA: Diagnosis not present

## 2023-10-18 DIAGNOSIS — D0512 Intraductal carcinoma in situ of left breast: Secondary | ICD-10-CM | POA: Diagnosis not present

## 2023-10-18 HISTORY — PX: AXILLARY LYMPH NODE DISSECTION: SHX5229

## 2023-10-18 SURGERY — LYMPHADENECTOMY, AXILLARY
Anesthesia: General | Site: Axilla | Laterality: Left

## 2023-10-18 MED ORDER — ACETAMINOPHEN 500 MG PO TABS
1000.0000 mg | ORAL_TABLET | Freq: Once | ORAL | Status: DC
Start: 2023-10-18 — End: 2023-10-18

## 2023-10-18 MED ORDER — ONDANSETRON HCL 4 MG/2ML IJ SOLN
INTRAMUSCULAR | Status: AC
  Filled 2023-10-18: qty 2

## 2023-10-18 MED ORDER — ACETAMINOPHEN 500 MG PO TABS
ORAL_TABLET | ORAL | Status: AC
  Filled 2023-10-18: qty 2

## 2023-10-18 MED ORDER — FENTANYL CITRATE (PF) 100 MCG/2ML IJ SOLN
INTRAMUSCULAR | Status: AC
  Filled 2023-10-18: qty 2

## 2023-10-18 MED ORDER — ONDANSETRON HCL 4 MG/2ML IJ SOLN
INTRAMUSCULAR | Status: DC | PRN
  Administered 2023-10-18: 4 mg via INTRAVENOUS

## 2023-10-18 MED ORDER — ACETAMINOPHEN 500 MG PO TABS
1000.0000 mg | ORAL_TABLET | ORAL | Status: AC
  Administered 2023-10-18: 1000 mg via ORAL

## 2023-10-18 MED ORDER — LACTATED RINGERS IV SOLN: INTRAVENOUS | Status: DC

## 2023-10-18 MED ORDER — 0.9 % SODIUM CHLORIDE (POUR BTL) OPTIME
TOPICAL | Status: DC | PRN
  Administered 2023-10-18: 900 mL

## 2023-10-18 MED ORDER — FENTANYL CITRATE (PF) 100 MCG/2ML IJ SOLN
INTRAMUSCULAR | Status: DC | PRN
  Administered 2023-10-18: 50 ug via INTRAVENOUS

## 2023-10-18 MED ORDER — FENTANYL CITRATE (PF) 100 MCG/2ML IJ SOLN
25.0000 ug | INTRAMUSCULAR | Status: DC | PRN
  Administered 2023-10-18 (×2): 50 ug via INTRAVENOUS

## 2023-10-18 MED ORDER — CEFAZOLIN SODIUM-DEXTROSE 2-4 GM/100ML-% IV SOLN
2.0000 g | INTRAVENOUS | Status: AC
  Administered 2023-10-18: 2 g via INTRAVENOUS

## 2023-10-18 MED ORDER — LIDOCAINE 2% (20 MG/ML) 5 ML SYRINGE
INTRAMUSCULAR | Status: DC | PRN
  Administered 2023-10-18: 80 mg via INTRAVENOUS

## 2023-10-18 MED ORDER — PROPOFOL 500 MG/50ML IV EMUL
INTRAVENOUS | Status: DC | PRN
  Administered 2023-10-18: 150 ug/kg/min via INTRAVENOUS

## 2023-10-18 MED ORDER — EPHEDRINE SULFATE (PRESSORS) 50 MG/ML IJ SOLN
INTRAMUSCULAR | Status: DC | PRN
  Administered 2023-10-18: 10 mg via INTRAVENOUS

## 2023-10-18 MED ORDER — CHLORHEXIDINE GLUCONATE CLOTH 2 % EX PADS: 6.0000 | MEDICATED_PAD | Freq: Once | CUTANEOUS | Status: DC

## 2023-10-18 MED ORDER — PROPOFOL 10 MG/ML IV BOLUS
INTRAVENOUS | Status: AC
  Filled 2023-10-18: qty 20

## 2023-10-18 MED ORDER — BUPIVACAINE-EPINEPHRINE 0.25% -1:200000 IJ SOLN
INTRAMUSCULAR | Status: DC | PRN
  Administered 2023-10-18: 10 mL

## 2023-10-18 MED ORDER — DEXAMETHASONE SODIUM PHOSPHATE 10 MG/ML IJ SOLN
INTRAMUSCULAR | Status: AC
  Filled 2023-10-18: qty 1

## 2023-10-18 MED ORDER — DEXAMETHASONE SODIUM PHOSPHATE 4 MG/ML IJ SOLN
INTRAMUSCULAR | Status: DC | PRN
  Administered 2023-10-18: 4 mg via INTRAVENOUS

## 2023-10-18 MED ORDER — PROPOFOL 10 MG/ML IV BOLUS
INTRAVENOUS | Status: DC | PRN
  Administered 2023-10-18: 100 mg via INTRAVENOUS
  Administered 2023-10-18: 20 mg via INTRAVENOUS

## 2023-10-18 MED ORDER — CEFAZOLIN SODIUM-DEXTROSE 2-4 GM/100ML-% IV SOLN
INTRAVENOUS | Status: AC
  Filled 2023-10-18: qty 100

## 2023-10-18 SURGICAL SUPPLY — 42 items
APPLIER CLIP 9.375 MED OPEN (MISCELLANEOUS) ×1
BENZOIN TINCTURE PRP APPL 2/3 (GAUZE/BANDAGES/DRESSINGS) ×2 IMPLANT
BLADE CLIPPER SURG (BLADE) IMPLANT
BLADE HEX COATED 2.75 (ELECTRODE) ×2 IMPLANT
BLADE SURG 15 STRL LF DISP TIS (BLADE) ×2 IMPLANT
BNDG COHESIVE 3X5 TAN ST LF (GAUZE/BANDAGES/DRESSINGS) IMPLANT
CANISTER SUCT 1200ML W/VALVE (MISCELLANEOUS) ×2 IMPLANT
CHLORAPREP W/TINT 26 (MISCELLANEOUS) ×2 IMPLANT
CLIP APPLIE 9.375 MED OPEN (MISCELLANEOUS) IMPLANT
COVER BACK TABLE 60X90IN (DRAPES) ×2 IMPLANT
COVER MAYO STAND STRL (DRAPES) ×2 IMPLANT
COVER PROBE CYLINDRICAL 5X96 (MISCELLANEOUS) ×2 IMPLANT
DRAPE LAPAROSCOPIC ABDOMINAL (DRAPES) ×2 IMPLANT
DRAPE UTILITY XL STRL (DRAPES) ×2 IMPLANT
DRSG TEGADERM 4X4.75 (GAUZE/BANDAGES/DRESSINGS) ×4 IMPLANT
ELECT REM PT RETURN 9FT ADLT (ELECTROSURGICAL) ×1
ELECTRODE REM PT RTRN 9FT ADLT (ELECTROSURGICAL) ×2 IMPLANT
GAUZE SPONGE 4X4 12PLY STRL LF (GAUZE/BANDAGES/DRESSINGS) ×2 IMPLANT
GLOVE BIO SURGEON STRL SZ7 (GLOVE) ×2 IMPLANT
GLOVE BIOGEL PI IND STRL 7.5 (GLOVE) ×2 IMPLANT
GOWN STRL REUS W/ TWL LRG LVL3 (GOWN DISPOSABLE) ×4 IMPLANT
NDL HYPO 25X1 1.5 SAFETY (NEEDLE) ×4 IMPLANT
NDL SAFETY ECLIPSE 18X1.5 (NEEDLE) ×2 IMPLANT
NEEDLE HYPO 25X1 1.5 SAFETY (NEEDLE) ×2
NS IRRIG 1000ML POUR BTL (IV SOLUTION) ×2 IMPLANT
PACK BASIN DAY SURGERY FS (CUSTOM PROCEDURE TRAY) ×2 IMPLANT
PENCIL SMOKE EVACUATOR (MISCELLANEOUS) ×2 IMPLANT
SHEET MEDIUM DRAPE 40X70 STRL (DRAPES) IMPLANT
SLEEVE SCD COMPRESS KNEE MED (STOCKING) ×2 IMPLANT
SPIKE FLUID TRANSFER (MISCELLANEOUS) IMPLANT
SPONGE T-LAP 4X18 ~~LOC~~+RFID (SPONGE) ×2 IMPLANT
STOCKINETTE IMPERVIOUS LG (DRAPES) IMPLANT
STRIP CLOSURE SKIN 1/2X4 (GAUZE/BANDAGES/DRESSINGS) ×2 IMPLANT
SUT MON AB 4-0 PC3 18 (SUTURE) ×2 IMPLANT
SUT VIC AB 3-0 SH 27X BRD (SUTURE) ×2 IMPLANT
SYR BULB EAR ULCER 3OZ GRN STR (SYRINGE) IMPLANT
SYR CONTROL 10ML LL (SYRINGE) ×4 IMPLANT
TOWEL GREEN STERILE FF (TOWEL DISPOSABLE) ×2 IMPLANT
TRACER MAGTRACE VIAL (MISCELLANEOUS) IMPLANT
TRAY FAXITRON CT DISP (TRAY / TRAY PROCEDURE) IMPLANT
TUBE CONNECTING 20X1/4 (TUBING) ×2 IMPLANT
YANKAUER SUCT BULB TIP NO VENT (SUCTIONS) ×2 IMPLANT

## 2023-10-18 NOTE — Discharge Instructions (Addendum)
[facility] Office Phone Number [phone]  BREAST BIOPSY/ PARTIAL MASTECTOMY: POST OP INSTRUCTIONS  Always review your discharge instruction sheet given to you by the facility where your surgery was performed.  IF YOU HAVE DISABILITY OR FAMILY LEAVE FORMS, YOU MUST BRING THEM TO THE OFFICE FOR PROCESSING.  DO NOT GIVE THEM TO YOUR DOCTOR.  A prescription for pain medication may be given to you upon discharge.  Take your pain medication as prescribed, if needed.  If narcotic pain medicine is not needed, then you may take acetaminophen (Tylenol) or ibuprofen (Advil) as needed. Take your usually prescribed medications unless otherwise directed If you need a refill on your pain medication, please contact your pharmacy.  They will contact our office to request authorization.  Prescriptions will not be filled after 5pm or on week-ends. You should eat very light the first 24 hours after surgery, such as soup, crackers, pudding, etc.  Resume your normal diet the day after surgery. Most patients will experience some swelling and bruising in the breast.  Ice packs and a good support bra will help.  Swelling and bruising can take several days to resolve.  It is common to experience some constipation if taking pain medication after surgery.  Increasing fluid intake and taking a stool softener will usually help or prevent this problem from occurring.  A mild laxative (Milk of Magnesia or Miralax) should be taken according to package directions if there are no bowel movements after 48 hours. Unless discharge instructions indicate otherwise, you may remove your bandages 24-48 hours after surgery, and you may shower at that time.  You may have steri-strips (small skin tapes) in place directly over the incision.  These strips should be left on the skin for 7-10 days.  If your surgeon used skin glue on the incision, you may shower in 24 hours.  The glue will flake off over the next 2-3 weeks.  Any  sutures or staples will be removed at the office during your follow-up visit. ACTIVITIES:  You may resume regular daily activities (gradually increasing) beginning the next day.  Wearing a good support bra or sports bra minimizes pain and swelling.  You may have sexual intercourse when it is comfortable. You may drive when you no longer are taking prescription pain medication, you can comfortably wear a seatbelt, and you can safely maneuver your car and apply brakes. RETURN TO WORK:  ______________________________________________________________________________________ Kara Pugh should see your doctor in the office for a follow-up appointment approximately two weeks after your surgery.  Your doctor's nurse will typically make your follow-up appointment when she calls you with your pathology report.  Expect your pathology report 2-3 business days after your surgery.  You may call to check if you do not hear from [place] after three days. OTHER INSTRUCTIONS: _______________________________________________________________________________________________ _____________________________________________________________________________________________________________________________________ _____________________________________________________________________________________________________________________________________ _____________________________________________________________________________________________________________________________________  WHEN TO CALL YOUR DOCTOR: Fever over 101.0 Nausea and/or vomiting. Extreme swelling or bruising. Continued bleeding from incision. Increased pain, redness, or drainage from the incision.  The clinic staff is available to answer your questions during regular business hours.  Please don't hesitate to call and ask to speak to one of the nurses for clinical concerns.  If you have a medical emergency, go to the nearest emergency room or call 911.  A surgeon from [facility] is always on call at the hospital.  For further questions, please visit [email or web address]   Post Anesthesia Home Care Instructions  Activity: Get plenty of rest for the remainder of the  day. A responsible individual must stay with you for 24 hours following the procedure.  For the next 24 hours, DO NOT: -Drive a car -Advertising copywriter -Drink alcoholic beverages -Take any medication unless instructed by your physician -Make any legal decisions or sign important papers.  Meals: Start with liquid foods such as gelatin or soup. Progress to regular foods as tolerated. Avoid greasy, spicy, heavy foods. If nausea and/or vomiting occur, drink only clear liquids until the nausea and/or vomiting subsides. Call your physician if vomiting continues.  Special Instructions/Symptoms: Your throat may feel dry or sore from the anesthesia or the breathing tube placed in your throat during surgery. If this causes discomfort, gargle with warm salt water. The discomfort should disappear within 24 hours.  Can take tylenol after 3:18 pm if needed

## 2023-10-18 NOTE — Op Note (Signed)
 Pre-op Diagnosis: Invasive ductal carcinoma left axilla Post-op Diagnosis: same Procedure: Left axillary breast lumpectomy Surgeon:  Skylur Fuston K. Anesthesia:  GEN - LMA Indications:  This is an 83 year old female with a history of left breast cancer over 20 years ago.  She was treated with a left lumpectomy followed by radiation and tamoxifen.  Apparently she was a subject in the tamoxifen trials.  She had been doing well until about 5 years ago.  She had a right breast mass seen on mammogram and underwent a biopsy in 2018.  This revealed a hamartoma and was felt to be benign.   In January 2024, she underwent further imaging that revealed that this mass had become larger.  She had a 1.4 x 0.9 x 1.2 cm mass in the retroareolar region at 12:00.  She underwent biopsy of this area which revealed a complex sclerosing lesion with no sign of malignancy.  On 11/10/2022, she underwent right breast radioactive seed localized lumpectomy.  Pathology confirmed complex sclerosing lesion and a sclerosed papilloma with no sign of malignancy.   In the last 2 months, patient developed a palpable mass in her left axilla.  She underwent diagnostic mammogram and ultrasound.  This revealed a 1.5 x 1.5 x 1.9 cm hypoechoic left axillary mass.  It was unclear whether this was a mass versus a regular lymph node.  She went biopsy of this area that revealed invasive ductal carcinoma.  There was no evidence of nodal tissue.  ER/PR strongly positive, Ki-67 15%, HER2 negative.  Further radiologic workup showed no other evidence of disease in the left breast.  After discussion with the patient, we have opted for a localized lumpectomy to get clear margins followed by probable radiation.  Description of procedure: The patient is brought to the operating room placed in supine position on the operating room table. After an adequate level of general anesthesia was obtained, her left breast and axilla were prepped with ChloraPrep and  draped in sterile fashion. A timeout was taken to ensure the proper patient and proper procedure.  The mass is easily palpated within the superficial soft tissues of the axilla.  I made a transverse incision across the axilla.  We dissected completely around the palpable mass with a margin of normal breast tissue.  I did not palpate any enlarged lymph nodes.  Hemostasis was obtained with clips as well as cautery.  The palpable mass is approximately 2 cm in diameter with approximately 1 cm margin of normal breast tissue around the mass.  The specimen was removed and was oriented with a paint kit. Specimen mammogram showed the [facility] clip within the specimen. This was sent for pathologic examination. We inspected carefully for hemostasis. The wound was thoroughly irrigated. The wound was closed with a deep layer of 3-0 Vicryl and a subcuticular layer of 4-0 Monocryl. Benzoin Steri-Strips were applied. The patient was then extubated and brought to the recovery room in stable condition. All sponge, instrument, and needle counts are correct.  Donnice POUR. Belinda, MD, [facility] Surgery  General/ Trauma Surgery  10/18/2023 12:45 PM

## 2023-10-18 NOTE — Anesthesia Procedure Notes (Signed)
 Procedure Name: LMA Insertion Date/Time: 10/18/2023 11:57 AM  Performed by: Julieanne Fairy BROCKS, CRNAPre-anesthesia Checklist: Patient identified, Emergency Drugs available, Suction available and Patient being monitored Patient Re-evaluated:Patient Re-evaluated prior to induction Oxygen Delivery Method: Circle system utilized Preoxygenation: Pre-oxygenation with 100% oxygen Induction Type: IV induction Ventilation: Mask ventilation without difficulty LMA: LMA inserted LMA Size: 4.0 Number of attempts: 1 Airway Equipment and Method: Bite block Placement Confirmation: positive ETCO2 Tube secured with: Tape Dental Injury: Teeth and Oropharynx as per pre-operative assessment

## 2023-10-18 NOTE — Transfer of Care (Signed)
 Immediate Anesthesia Transfer of Care Note  Patient: Kara Pugh  Procedure(s) Performed: LEFT AXILLARY LUMPECTOMY (Left: Axilla)  Patient Location: PACU  Anesthesia Type:General  Level of Consciousness: awake, alert , and oriented  Airway & Oxygen Therapy: Patient Spontanous Breathing and Patient connected to face mask oxygen  Post-op Assessment: Report given to RN and Post -op Vital signs reviewed and stable  Post vital signs: Reviewed and stable  Last Vitals:  Vitals Value Taken Time  BP 109/62 10/18/23 1248  Temp    Pulse 78 10/18/23 1249  Resp 18 10/18/23 1249  SpO2 99 % 10/18/23 1249  Vitals shown include unfiled device data.  Last Pain:  Vitals:   10/18/23 0912  TempSrc: Temporal  PainSc: 0-No pain      Patients Stated Pain Goal: 3 (10/18/23 0912)  Complications: No notable events documented.

## 2023-10-18 NOTE — Anesthesia Preprocedure Evaluation (Addendum)
Anesthesia Evaluation  Patient identified by MRN, date of birth, ID band Patient awake    Reviewed: Allergy & Precautions, NPO status , Patient's Chart, lab work & pertinent test results  History of Anesthesia Complications (+) PONV and history of anesthetic complications  Airway Mallampati: III  TM Distance: >3 FB Neck ROM: Full    Dental no notable dental hx. (+) Teeth Intact, Dental Advisory Given   Pulmonary asthma    Pulmonary exam normal breath sounds clear to auscultation       Cardiovascular hypertension, Pt. on medications Normal cardiovascular exam Rhythm:Regular Rate:Normal     Neuro/Psych negative neurological ROS  negative psych ROS   GI/Hepatic negative GI ROS,,,(+) Hepatitis -, B  Endo/Other  Hypothyroidism    Renal/GU negative Renal ROS  negative genitourinary   Musculoskeletal negative musculoskeletal ROS (+)    Abdominal   Peds  Hematology negative hematology ROS (+)   Anesthesia Other Findings   Reproductive/Obstetrics                             Anesthesia Physical Anesthesia Plan  ASA: 2  Anesthesia Plan: General   Post-op Pain Management: Tylenol PO (pre-op)*   Induction: Intravenous  PONV Risk Score and Plan: 4 or greater and Ondansetron, Dexamethasone, Treatment may vary due to age or medical condition and TIVA  Airway Management Planned: LMA  Additional Equipment:   Intra-op Plan:   Post-operative Plan: Extubation in OR  Informed Consent: I have reviewed the patients History and Physical, chart, labs and discussed the procedure including the risks, benefits and alternatives for the proposed anesthesia with the patient or authorized representative who has indicated his/her understanding and acceptance.     Dental advisory given  Plan Discussed with: CRNA  Anesthesia Plan Comments:        Anesthesia Quick Evaluation

## 2023-10-18 NOTE — Interval H&P Note (Signed)
 History and Physical Interval Note:  10/18/2023 10:10 AM  Kara Pugh  has presented today for surgery, with the diagnosis of LEFT AXILLARY INVASIVE DUCTAL CARCINOMA.  The various methods of treatment have been discussed with the patient and family. After consideration of risks, benefits and other options for treatment, the patient has consented to  Procedure(s) with comments: LEFT AXILLARY LUMPECTOMY (Left) - LMA as a surgical intervention.  The patient's history has been reviewed, patient examined, no change in status, stable for surgery.  I have reviewed the patient's chart and labs.  Questions were answered to the patient's satisfaction.     [name]

## 2023-10-18 NOTE — Anesthesia Postprocedure Evaluation (Signed)
 Anesthesia Post Note  Patient: Kara Pugh  Procedure(s) Performed: LEFT AXILLARY LUMPECTOMY (Left: Axilla)     Patient location during evaluation: PACU Anesthesia Type: General Level of consciousness: awake and alert Pain management: pain level controlled Vital Signs Assessment: post-procedure vital signs reviewed and stable Respiratory status: spontaneous breathing, nonlabored ventilation, respiratory function stable and patient connected to nasal cannula oxygen Cardiovascular status: blood pressure returned to baseline and stable Postop Assessment: no apparent nausea or vomiting Anesthetic complications: no   No notable events documented.  Last Vitals:  Vitals:   10/18/23 1315 10/18/23 1339  BP: 98/63 125/66  Pulse: 65 61  Resp: 12 16  Temp:  (!) 36.2 C  SpO2: 95% 95%    Last Pain:  Vitals:   10/18/23 1339  TempSrc:   PainSc: 1                  Garnette DELENA Gab

## 2023-10-19 ENCOUNTER — Encounter (HOSPITAL_BASED_OUTPATIENT_CLINIC_OR_DEPARTMENT_OTHER): Payer: Self-pay | Admitting: Surgery

## 2023-10-21 LAB — SURGICAL PATHOLOGY

## 2023-10-24 ENCOUNTER — Encounter: Payer: Self-pay | Admitting: *Deleted

## 2023-10-25 ENCOUNTER — Telehealth: Payer: Self-pay | Admitting: Radiation Oncology

## 2023-10-25 NOTE — Telephone Encounter (Signed)
error 

## 2023-10-27 DIAGNOSIS — H524 Presbyopia: Secondary | ICD-10-CM | POA: Diagnosis not present

## 2023-10-27 DIAGNOSIS — H348322 Tributary (branch) retinal vein occlusion, left eye, stable: Secondary | ICD-10-CM | POA: Diagnosis not present

## 2023-10-27 DIAGNOSIS — H26492 Other secondary cataract, left eye: Secondary | ICD-10-CM | POA: Diagnosis not present

## 2023-10-27 DIAGNOSIS — H04123 Dry eye syndrome of bilateral lacrimal glands: Secondary | ICD-10-CM | POA: Diagnosis not present

## 2023-10-27 DIAGNOSIS — H43813 Vitreous degeneration, bilateral: Secondary | ICD-10-CM | POA: Diagnosis not present

## 2023-10-27 DIAGNOSIS — H52203 Unspecified astigmatism, bilateral: Secondary | ICD-10-CM | POA: Diagnosis not present

## 2023-11-07 ENCOUNTER — Telehealth: Payer: Self-pay

## 2023-11-07 NOTE — Telephone Encounter (Signed)
 Spoke with patient and confirmed visit for 11/08/23

## 2023-11-07 NOTE — Progress Notes (Unsigned)
 [facility] CONSULT NOTE  Patient Care Team: Sigmund Hazel, MD as PCP - General (Family Medicine) Pershing Proud, RN as Oncology Nurse Navigator Donnelly Angelica, RN as Oncology Nurse Navigator Rachel Moulds, MD as Consulting Physician (Hematology and Oncology)  CHIEF COMPLAINTS/PURPOSE OF CONSULTATION:  Newly diagnosed breast cancer  HISTORY OF PRESENTING ILLNESS:  Kara Pugh 83 y.o. 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 The patient noticed a lump in the left axillary area a couple of months ago, which has remained unchanged. A mammogram and ultrasound were performed, and a biopsy of the axillary mass suggested breast cancer.  There was no evidence of residual nodal tissue in this biopsy.  Prognostics from this showed ER +95% strong staining, PR +95% strong staining HER2 1+ Ki-67 of 15%.  She then had MRI breast which showed a couple areas of suspicion pending ultrasound-guided biopsy.  The patient's previous breast cancer was treated with a lumpectomy, radiation, and two years of tamoxifen approximately 35 years ago. The patient has a family history of breast cancer, with both her mother and her mother's sister having had the disease.  Discussed the use of AI scribe software for clinical note transcription with the patient, who gave verbal consent to proceed.  History of Present Illness    Kara Pugh is an 83 year old female with breast cancer who presents for follow-up after recent breast surgery.  She recently underwent a lumpectomy on her left breast, where a 1.9 cm papillary carcinoma was excised with clear margins. The tumor was estrogen and progesterone receptor-positive and HER2-negative. No lymph nodes were removed during the procedure.  She experiences no significant pain, oozing, or drainage from the surgical  site, although there is persistent soreness that is gradually subsiding. A fluid collection at the surgical site is noted, attributed to post-surgical changes. No fevers, chills, or signs of infection are present.  Her past treatment for breast cancer included tamoxifen, which she took many years ago. Genetic testing was negative for any mutations. A bone density test conducted over two years ago was normal.  In her social history, she plans to go on a cruise to [place] countries with her husband in late April. She engages in walking as a form of exercise, which does not cause any discomfort.  MEDICAL HISTORY:  Past Medical History:  Diagnosis Date   Arthritis    knees   Asthma    as child, no problems now   Breast cancer (HCC) 1990   Left Breast Cancer   Breast cancer (HCC) 09/2023   left breast IDC   Hepatitis    Hx Hep B 1970's   High cholesterol    Hypertension    Hypothyroidism    Personal history of radiation therapy 1990   Pneumonia    Summer 2023   PONV (postoperative nausea and vomiting)    Thyroid disease     SURGICAL HISTORY: Past Surgical History:  Procedure Laterality Date   ABDOMINAL HYSTERECTOMY     Total   AXILLARY LYMPH NODE DISSECTION Left 10/18/2023   Procedure: LEFT AXILLARY LUMPECTOMY;  Surgeon: Manus Rudd, MD;  Location: [facility];  Service: General;  Laterality: Left;  LMA   BREAST BIOPSY Right 10/05/2022   [place] RT BREAST BX W LOC DEV 1ST LESION IMG BX SPEC US GUIDE 10/05/2022  [facility]   BREAST BIOPSY  11/09/2022   MM RT RADIOACTIVE SEED LOC MAMMO GUIDE 11/09/2022 [facility]   BREAST BIOPSY Right 01/11/2017   BREAST BIOPSY Left 09/29/2023   [place] LT BREAST BX W LOC DEV 1ST LESION IMG BX SPEC US GUIDE 09/29/2023 [facility]   BREAST LUMPECTOMY Left 1990   CATARACT EXTRACTION W/ INTRAOCULAR LENS IMPLANT Bilateral    COLONOSCOPY     RADIOACTIVE SEED GUIDED EXCISIONAL BREAST BIOPSY Right 11/10/2022   Procedure:  RADIOACTIVE SEED GUIDED EXCISIONAL RIGHT BREAST BIOPSY;  Surgeon: Manus Rudd, MD;  Location: [facility] OR;  Service: General;  Laterality: Right;   THYROIDECTOMY, PARTIAL  1973   TONSILLECTOMY     As a child    SOCIAL HISTORY: Social History   Socioeconomic History   Marital status: Married    Spouse name: Not on file   Number of children: Not on file   Years of education: Not on file   Highest education level: Not on file  Occupational History   Not on file  Tobacco Use   Smoking status: Never   Smokeless tobacco: Never  Vaping Use   Vaping status: Never Used  Substance and Sexual Activity   Alcohol use: Yes    Comment: Occasionally   Drug use: No   Sexual activity: Not Currently    Birth control/protection: Surgical    Comment: hyst  Other Topics Concern   Not on file  Social History Narrative   Not on file   Social Drivers of Health   Financial Resource Strain: Not on file  Food Insecurity: Not on file  Transportation Needs: Not on file  Physical Activity: Not on file  Stress: Not on file  Social Connections: Unknown (01/16/2022)   Received from [facility]   Social Network    Social Network: Not on file  Intimate Partner Violence: Unknown (12/18/2021)   Received from [facility], [facility]   HITS    Physically Hurt: Not on file    Insult or Talk Down To: Not on file    Threaten Physical Harm: Not on file    Scream or Curse: Not on file    FAMILY HISTORY: Family History  Problem Relation Age of Onset   Breast cancer Mother        dx 77s   Breast cancer Maternal Aunt        dx 60s-70s   Breast cancer Maternal Aunt        dx >50   Uterine cancer Maternal Aunt        dx > 50   Uterine cancer Cousin 50       d. 50    ALLERGIES:  is allergic to latex, codeine, and sulfa antibiotics.  MEDICATIONS:  Current Outpatient Medications  Medication Sig Dispense Refill   Cholecalciferol (VITAMIN D) 50 MCG (2000 UT) tablet Take 2,000 Units  by mouth daily.     Coenzyme Q10 (CO Q-10) 100 MG CAPS Take 100 mg by mouth daily.     levothyroxine (SYNTHROID) 112 MCG tablet Take 112 mcg by mouth daily before breakfast.     loratadine (CLARITIN) 10 MG tablet Take 10 mg by mouth daily.     losartan (COZAAR) 25 MG tablet Take 25 mg by mouth daily.     Multiple Vitamin (MULTIVITAMIN WITH MINERALS) TABS tablet Take 1 tablet by mouth daily.     simvastatin (ZOCOR) 20 MG tablet Take 20 mg by mouth daily.  No current facility-administered medications for this visit.    REVIEW OF SYSTEMS:   Constitutional: Denies fevers, chills or abnormal night sweats Eyes: Denies blurriness of vision, double vision or watery eyes Ears, nose, mouth, throat, and face: Denies mucositis or sore throat Respiratory: Denies cough, dyspnea or wheezes Cardiovascular: Denies palpitation, chest discomfort or lower extremity swelling Gastrointestinal:  Denies nausea, heartburn or change in bowel habits Skin: Denies abnormal skin rashes Lymphatics: Denies new lymphadenopathy or easy bruising Neurological:Denies numbness, tingling or new weaknesses Behavioral/Psych: Mood is stable, no new changes  Breast: Denies any palpable lumps or discharge All other systems were reviewed with the patient and are negative.  PHYSICAL EXAMINATION: ECOG PERFORMANCE STATUS: 0 - Asymptomatic  Vitals:   11/08/23 0922  BP: (!) 146/68  Pulse: 70  Resp: 17  Temp: 98 F (36.7 C)  SpO2: 99%    Filed Weights   11/08/23 0922  Weight: 165 lb 3.2 oz (74.9 kg)     GENERAL:alert, no distress and comfortable BREAST: No palpable masses in the breast.  Post op seroma noted.   LABORATORY DATA:  I have reviewed the data as listed Lab Results  Component Value Date   WBC 5.9 09/23/2023   HGB 14.8 09/23/2023   HCT 44.2 09/23/2023   MCV 94.8 09/23/2023   PLT 175 09/23/2023   Lab Results  Component Value Date   NA 137 09/23/2023   K 4.4 09/23/2023   CL 102 09/23/2023   CO2  28 09/23/2023    RADIOGRAPHIC STUDIES: I have personally reviewed the radiological reports and agreed with the findings in the report.  ASSESSMENT AND PLAN:   This is a very pleasant 83 year old postmenopausal female patient with prior history of left-sided breast cancer about 35 years ago, had lumpectomy adjuvant radiation and 2 years of tamoxifen now presents with a left axillary mass that was self palpated about 2 years ago.  She had further investigation and is here to review recommendations from medical oncology.  Breast Cancer Papillary carcinoma of the left breast, 1.9 cm, estrogen and progesterone receptor positive, HER2 negative. No lymph nodes removed. No current signs of infection or complications from surgery. -Consult with radiation oncology to determine if radiation therapy is indicated given prior history of breast cancer and radiation treatment. -Plan to start hormonal therapy with Letrozole after radiation therapy -Order bone density scan as baseline before starting Letrozole, given potential impact on bone density.  Post-Surgical Care Healing well from recent surgery with no signs of infection or complications. Fluid collection noted at surgical site, likely due to lack of compression. -Advise patient to wear a higher, tighter bra or compression bra to help with fluid collection at surgical site.  General Health Maintenance -Next appointment scheduled for mid-May after patient's cruise.  All questions were answered. The patient knows to call the clinic with any problems, questions or concerns.    Rachel Moulds, MD 11/08/23

## 2023-11-08 ENCOUNTER — Inpatient Hospital Stay: Payer: Medicare Other | Attending: Hematology and Oncology | Admitting: Hematology and Oncology

## 2023-11-08 VITALS — BP 146/68 | HR 70 | Temp 98.0°F | Resp 17 | Wt 165.2 lb

## 2023-11-08 DIAGNOSIS — Z170 Estrogen receptor positive status [ER+]: Secondary | ICD-10-CM | POA: Insufficient documentation

## 2023-11-08 DIAGNOSIS — Z803 Family history of malignant neoplasm of breast: Secondary | ICD-10-CM | POA: Insufficient documentation

## 2023-11-08 DIAGNOSIS — Z79899 Other long term (current) drug therapy: Secondary | ICD-10-CM | POA: Insufficient documentation

## 2023-11-08 DIAGNOSIS — C50612 Malignant neoplasm of axillary tail of left female breast: Secondary | ICD-10-CM | POA: Diagnosis not present

## 2023-11-09 ENCOUNTER — Other Ambulatory Visit: Payer: Self-pay | Admitting: Hematology and Oncology

## 2023-11-09 ENCOUNTER — Telehealth: Payer: Self-pay | Admitting: Hematology and Oncology

## 2023-11-09 ENCOUNTER — Encounter: Payer: Self-pay | Admitting: *Deleted

## 2023-11-09 DIAGNOSIS — Z961 Presence of intraocular lens: Secondary | ICD-10-CM | POA: Diagnosis not present

## 2023-11-09 DIAGNOSIS — H34832 Tributary (branch) retinal vein occlusion, left eye, with macular edema: Secondary | ICD-10-CM | POA: Diagnosis not present

## 2023-11-09 DIAGNOSIS — H35361 Drusen (degenerative) of macula, right eye: Secondary | ICD-10-CM | POA: Diagnosis not present

## 2023-11-09 DIAGNOSIS — H3562 Retinal hemorrhage, left eye: Secondary | ICD-10-CM | POA: Diagnosis not present

## 2023-11-09 DIAGNOSIS — H43813 Vitreous degeneration, bilateral: Secondary | ICD-10-CM | POA: Diagnosis not present

## 2023-11-09 DIAGNOSIS — C50612 Malignant neoplasm of axillary tail of left female breast: Secondary | ICD-10-CM

## 2023-11-09 NOTE — Telephone Encounter (Signed)
 Left patient a vm regarding upcoming appointment

## 2023-11-09 NOTE — Progress Notes (Signed)
 I tried calling Kara Pugh to convey recommendations from rad onc that she can move on with anti estrogen therapy. Called both numbers on file, no answer. No voicemail box to leave messages.

## 2023-11-10 ENCOUNTER — Other Ambulatory Visit: Payer: Self-pay | Admitting: Hematology and Oncology

## 2023-11-10 MED ORDER — LETROZOLE 2.5 MG PO TABS: 2.5000 mg | ORAL_TABLET | Freq: Every day | ORAL | 3 refills | Status: AC

## 2023-11-10 NOTE — Progress Notes (Signed)
 Talked to patient about omitting radiation. She is ready to proceed with letrozole as discussed Dispensed this to pharmacy of her choice.  Kameah Rawl

## 2023-11-14 ENCOUNTER — Ambulatory Visit: Payer: Self-pay | Admitting: Surgery

## 2023-11-14 NOTE — Progress Notes (Signed)
   PROVIDER:  DONNICE DEWAYNE LIMA, MD  MRN: [id number] DOB: 1941-02-03 DATE OF ENCOUNTER: 11/14/2023 Interval History:   This is an 83 year old female with a history of left breast cancer over 20 years ago.  She was treated with a left lumpectomy followed by radiation and tamoxifen.  Apparently she was a subject in the tamoxifen trials.  She had been doing well until about 5 years ago.  She had a right breast mass seen on mammogram and underwent a biopsy in 2018.  This revealed a hamartoma and was felt to be benign.   In January 2024, she underwent further imaging that revealed that this mass had become larger.  She had a 1.4 x 0.9 x 1.2 cm mass in the retroareolar region at 12:00.  She underwent biopsy of this area which revealed a complex sclerosing lesion with no sign of malignancy.  On 11/10/2022, she underwent right breast radioactive seed localized lumpectomy.  Pathology confirmed complex sclerosing lesion and a sclerosed papilloma with no sign of malignancy.   In the last 2 months, patient developed a palpable mass in her left axilla.  She underwent diagnostic mammogram and ultrasound.  This revealed a 1.5 x 1.5 x 1.9 cm hypoechoic left axillary mass.  It was unclear whether this was a mass versus a regular lymph node.  She went biopsy of this area that revealed invasive ductal carcinoma.  There was no evidence of nodal tissue.  ER/PR strongly positive, Ki-67 15%, HER2 negative.  The patient presents now for surgical evaluation.  She has not had any further imaging other than the diagnostic mammogram in early December and the axillary ultrasound to look for other primary sources.  On 10/18/23, she underwent left axillary breast lumpectomy.  Pathology revealed a 1.9 cm solid papillary carcinoma with DCIS.  The margins were negative.  ER/PR positive HER2 negative Ki-67 15%.  She has had conversations with oncology as well as radiation oncology.  They are planning letrozole  without radiation.  Physical  Examination:   Physical Exam   Her left axillary incision is healing well with no sign of infection.  No seroma.  No palpable lymphadenopathy.   Assessment and Plan:   Kara Pugh is a 83 y.o. female who underwent left axillary lumpectomy on 2//25.  Diagnoses and all orders for this visit:  Invasive ductal carcinoma of breast, left (CMS/HHS-HCC)     Proceed with adjuvant hormonal therapy.  Return in about 6 months (around 05/16/2024).   The plan was discussed in detail with the patient today, who expressed understanding.  The patient has my contact information, and understands to call me with any additional questions or concerns in the interval.  I would be happy to see the patient back sooner if the need arises.   MATTHEW KAI TSUEI, MD

## 2023-11-16 ENCOUNTER — Telehealth: Payer: Self-pay | Admitting: Adult Health

## 2023-11-16 NOTE — Telephone Encounter (Signed)
 Patient is aware of adjusted times/dates for scheduled visit on 02/07/2024

## 2023-11-23 ENCOUNTER — Ambulatory Visit (HOSPITAL_BASED_OUTPATIENT_CLINIC_OR_DEPARTMENT_OTHER)
Admission: RE | Admit: 2023-11-23 | Discharge: 2023-11-23 | Disposition: A | Discharge status: Home / Self Care | Source: Ambulatory Visit | Attending: Hematology and Oncology | Admitting: Hematology and Oncology

## 2023-11-23 DIAGNOSIS — Z13820 Encounter for screening for osteoporosis: Secondary | ICD-10-CM | POA: Diagnosis not present

## 2023-11-23 DIAGNOSIS — C50612 Malignant neoplasm of axillary tail of left female breast: Secondary | ICD-10-CM

## 2023-11-23 DIAGNOSIS — Z170 Estrogen receptor positive status [ER+]: Secondary | ICD-10-CM | POA: Diagnosis not present

## 2023-11-23 DIAGNOSIS — Z780 Asymptomatic menopausal state: Secondary | ICD-10-CM | POA: Diagnosis not present

## 2023-11-23 DIAGNOSIS — Z79811 Long term (current) use of aromatase inhibitors: Secondary | ICD-10-CM | POA: Diagnosis not present

## 2023-12-22 ENCOUNTER — Encounter: Payer: Medicare Other | Admitting: Genetic Counselor

## 2023-12-22 ENCOUNTER — Other Ambulatory Visit: Payer: Medicare Other

## 2023-12-26 DIAGNOSIS — H34832 Tributary (branch) retinal vein occlusion, left eye, with macular edema: Secondary | ICD-10-CM | POA: Diagnosis not present

## 2023-12-26 DIAGNOSIS — H35361 Drusen (degenerative) of macula, right eye: Secondary | ICD-10-CM | POA: Diagnosis not present

## 2023-12-26 DIAGNOSIS — H3562 Retinal hemorrhage, left eye: Secondary | ICD-10-CM | POA: Diagnosis not present

## 2023-12-26 DIAGNOSIS — Z961 Presence of intraocular lens: Secondary | ICD-10-CM | POA: Diagnosis not present

## 2023-12-26 DIAGNOSIS — H43813 Vitreous degeneration, bilateral: Secondary | ICD-10-CM | POA: Diagnosis not present

## 2024-01-30 DIAGNOSIS — M25461 Effusion, right knee: Secondary | ICD-10-CM | POA: Diagnosis not present

## 2024-01-30 DIAGNOSIS — S80911A Unspecified superficial injury of right knee, initial encounter: Secondary | ICD-10-CM | POA: Diagnosis not present

## 2024-01-30 DIAGNOSIS — M25561 Pain in right knee: Secondary | ICD-10-CM | POA: Diagnosis not present

## 2024-02-03 ENCOUNTER — Other Ambulatory Visit: Payer: Self-pay | Admitting: *Deleted

## 2024-02-03 DIAGNOSIS — C50612 Malignant neoplasm of axillary tail of left female breast: Secondary | ICD-10-CM

## 2024-02-07 ENCOUNTER — Inpatient Hospital Stay

## 2024-02-07 ENCOUNTER — Ambulatory Visit: Payer: Medicare Other | Admitting: Hematology and Oncology

## 2024-02-07 ENCOUNTER — Encounter: Payer: Self-pay | Admitting: Adult Health

## 2024-02-07 ENCOUNTER — Inpatient Hospital Stay: Attending: Adult Health | Admitting: Adult Health

## 2024-02-07 VITALS — BP 153/77 | HR 76 | Temp 98.6°F | Wt 167.7 lb

## 2024-02-07 DIAGNOSIS — Z170 Estrogen receptor positive status [ER+]: Secondary | ICD-10-CM | POA: Insufficient documentation

## 2024-02-07 DIAGNOSIS — E039 Hypothyroidism, unspecified: Secondary | ICD-10-CM | POA: Insufficient documentation

## 2024-02-07 DIAGNOSIS — Z803 Family history of malignant neoplasm of breast: Secondary | ICD-10-CM | POA: Insufficient documentation

## 2024-02-07 DIAGNOSIS — Z79899 Other long term (current) drug therapy: Secondary | ICD-10-CM | POA: Insufficient documentation

## 2024-02-07 DIAGNOSIS — Z79811 Long term (current) use of aromatase inhibitors: Secondary | ICD-10-CM | POA: Diagnosis not present

## 2024-02-07 DIAGNOSIS — Z1732 Human epidermal growth factor receptor 2 negative status: Secondary | ICD-10-CM | POA: Insufficient documentation

## 2024-02-07 DIAGNOSIS — E559 Vitamin D deficiency, unspecified: Secondary | ICD-10-CM | POA: Insufficient documentation

## 2024-02-07 DIAGNOSIS — Z808 Family history of malignant neoplasm of other organs or systems: Secondary | ICD-10-CM | POA: Diagnosis not present

## 2024-02-07 DIAGNOSIS — I10 Essential (primary) hypertension: Secondary | ICD-10-CM | POA: Insufficient documentation

## 2024-02-07 DIAGNOSIS — Z1721 Progesterone receptor positive status: Secondary | ICD-10-CM | POA: Insufficient documentation

## 2024-02-07 DIAGNOSIS — C50412 Malignant neoplasm of upper-outer quadrant of left female breast: Secondary | ICD-10-CM | POA: Insufficient documentation

## 2024-02-07 DIAGNOSIS — D3610 Benign neoplasm of peripheral nerves and autonomic nervous system, unspecified: Secondary | ICD-10-CM | POA: Insufficient documentation

## 2024-02-07 DIAGNOSIS — C50612 Malignant neoplasm of axillary tail of left female breast: Secondary | ICD-10-CM

## 2024-02-07 DIAGNOSIS — I700 Atherosclerosis of aorta: Secondary | ICD-10-CM | POA: Insufficient documentation

## 2024-02-07 DIAGNOSIS — E7800 Pure hypercholesterolemia, unspecified: Secondary | ICD-10-CM | POA: Insufficient documentation

## 2024-02-07 DIAGNOSIS — J302 Other seasonal allergic rhinitis: Secondary | ICD-10-CM | POA: Insufficient documentation

## 2024-02-07 LAB — CMP (CANCER CENTER ONLY)
ALT: 19 U/L (ref 0–44)
AST: 31 U/L (ref 15–41)
Albumin: 4.3 g/dL (ref 3.5–5.0)
Alkaline Phosphatase: 69 U/L (ref 38–126)
Anion gap: 6 (ref 5–15)
BUN: 15 mg/dL (ref 8–23)
CO2: 30 mmol/L (ref 22–32)
Calcium: 9.6 mg/dL (ref 8.9–10.3)
Chloride: 98 mmol/L (ref 98–111)
Creatinine: 0.86 mg/dL (ref 0.44–1.00)
GFR, Estimated: >60 mL/min (ref >60)
Glucose, Bld: 80 mg/dL (ref 70–99)
Potassium: 4.4 mmol/L (ref 3.5–5.1)
Sodium: 134 mmol/L — ABNORMAL LOW (ref 135–145)
Total Bilirubin: 0.6 mg/dL (ref 0.0–1.2)
Total Protein: 7 g/dL (ref 6.5–8.1)

## 2024-02-07 LAB — CBC WITH DIFFERENTIAL (CANCER CENTER ONLY)
Abs Immature Granulocytes: 0.02 10*3/uL (ref 0.00–0.07)
Basophils Absolute: 0.1 10*3/uL (ref 0.0–0.1)
Basophils Relative: 1 %
Eosinophils Absolute: 0.3 10*3/uL (ref 0.0–0.5)
Eosinophils Relative: 4 %
HCT: 40.4 % (ref 36.0–46.0)
Hemoglobin: 13.8 g/dL (ref 12.0–15.0)
Immature Granulocytes: 0 %
Lymphocytes Relative: 17 %
Lymphs Abs: 1.1 10*3/uL (ref 0.7–4.0)
MCH: 31.2 pg (ref 26.0–34.0)
MCHC: 34.2 g/dL (ref 30.0–36.0)
MCV: 91.2 fL (ref 80.0–100.0)
Monocytes Absolute: 0.7 10*3/uL (ref 0.1–1.0)
Monocytes Relative: 11 %
Neutro Abs: 4.2 10*3/uL (ref 1.7–7.7)
Neutrophils Relative %: 67 %
Platelet Count: 180 10*3/uL (ref 150–400)
RBC: 4.43 MIL/uL (ref 3.87–5.11)
RDW: 12.3 % (ref 11.5–15.5)
WBC Count: 6.3 10*3/uL (ref 4.0–10.5)
nRBC: 0 % (ref 0.0–0.2)

## 2024-02-07 NOTE — Progress Notes (Signed)
 SURVIVORSHIP VISIT:  BRIEF ONCOLOGIC HISTORY:  Oncology History  Malignant neoplasm of upper-outer quadrant of left breast in female, estrogen receptor positive (HCC)  08/23/2023 Mammogram   Diagnostic mammogram: palpable LEFT axillary lump. Patient with history of remote LEFT breast cancer and lumpectomy and more recent excision of RIGHT breast complex sclerosing lesion. 1.9cm axillary mass versus irregular abnormal lymph node noted, no abnormality noted in either breast, biopsy recommended.    08/24/2023 Initial Biopsy   Left axillary LN biopsy: Ductal carcinoma with solid and papillary features.  ER+, PR+, Ki-67 15%, HER-2 negative   09/23/2023 Breast MRI   0.8cm mass in left lateral posterior breast, 0.8 cm oval mass in central slgihtly medial left breast   09/29/2023 Initial Biopsy   US  biopsy: Left breast needle core biopsy 3 o'clock, 9cmfn: LN with reactive changes, negative for carcinoma.     10/05/2023 Genetic Testing   Negative genetic testing on the CancerNext+RNAinsight panel.  The report date is October 05, 2023.  The [facility] CancerNext+RNAinsight Panel includes sequencing, rearrangement analysis, and RNA analysis for the following 39 genes: APC, ATM, BAP1, BARD1, BMPR1A, BRCA1, BRCA2, BRIP1, CDH1, CDKN2A, CHEK2, FH, FLCN, MET, MLH1, MSH2, MSH6, MUTYH, NF1, NTHL1, PALB2, PMS2, PTEN, RAD51C, RAD51D, SMAD4, STK11, TP53, TSC1, TSC2, and VHL (sequencing and deletion/duplication); AXIN2, HOXB13, MBD4, MSH3, POLD1 and POLE (sequencing only); EPCAM and GREM1 (deletion/duplication only).   10/06/2023 Initial Biopsy   Left breast posterior medial needle core MR biopsy: Benign breast tissue no malignancy   10/2023 -  Anti-estrogen oral therapy   Letrozole    10/18/2023 Surgery   Left breast axillary lumpectomy: solid pappillary carcinoma, 1.9cm, grade 2, margins negative, ER/PR positive, HER2 negative, T1c, N0     INTERVAL HISTORY:  Ms. Wolfert to review her survivorship care plan detailing  her treatment course for breast cancer, as well as monitoring long-term side effects of that treatment, education regarding health maintenance, screening, and overall wellness and health promotion.     Overall, Ms. Schuelke reports feeling quite well.  She is taking letrozole  daily and tolerates it well other than an occasional hot flash that is mild and tolerable.  REVIEW OF SYSTEMS:  Review of Systems  Constitutional:  Negative for appetite change, chills, fatigue, fever and unexpected weight change.  HENT:   Negative for hearing loss, lump/mass and trouble swallowing.   Eyes:  Negative for eye problems and icterus.  Respiratory:  Negative for chest tightness, cough and shortness of breath.   Cardiovascular:  Negative for chest pain, leg swelling and palpitations.  Gastrointestinal:  Negative for abdominal distention, abdominal pain, constipation, diarrhea, nausea and vomiting.  Endocrine: Positive for hot flashes.  Genitourinary:  Negative for difficulty urinating.   Musculoskeletal:  Negative for arthralgias.  Skin:  Negative for itching and rash.  Neurological:  Negative for dizziness, extremity weakness, headaches and numbness.  Hematological:  Negative for adenopathy. Does not bruise/bleed easily.  Psychiatric/Behavioral:  Negative for depression. The patient is not nervous/anxious.    Breast: Denies any new nodularity, masses, tenderness, nipple changes, or nipple discharge.       PAST MEDICAL/SURGICAL HISTORY:  Past Medical History:  Diagnosis Date   Arthritis    knees   Asthma    as child, no problems now   Breast cancer (HCC) 1990   Left Breast Cancer   Breast cancer (HCC) 09/2023   left breast IDC   Hepatitis    Hx Hep B 1970's   High cholesterol    Hypertension  Hypothyroidism    Personal history of radiation therapy 1990   Pneumonia    Summer 2023   PONV (postoperative nausea and vomiting)    Thyroid  disease    Past Surgical History:  Procedure Laterality  Date   ABDOMINAL HYSTERECTOMY     Total   AXILLARY LYMPH NODE DISSECTION Left 10/18/2023   Procedure: LEFT AXILLARY LUMPECTOMY;  Surgeon: Dareen Ebbing, MD;  Location: [facility];  Service: General;  Laterality: Left;  LMA   BREAST BIOPSY Right 10/05/2022   US  RT BREAST BX W LOC DEV 1ST LESION IMG BX SPEC US  GUIDE 10/05/2022 [facility]   BREAST BIOPSY  11/09/2022   MM RT RADIOACTIVE SEED LOC MAMMO GUIDE 11/09/2022 [facility]   BREAST BIOPSY Right 01/11/2017   BREAST BIOPSY Left 09/29/2023   US  LT BREAST BX W LOC DEV 1ST LESION IMG BX SPEC US  GUIDE 09/29/2023 [facility]   BREAST LUMPECTOMY Left 1990   CATARACT EXTRACTION W/ INTRAOCULAR LENS IMPLANT Bilateral    COLONOSCOPY     RADIOACTIVE SEED GUIDED EXCISIONAL BREAST BIOPSY Right 11/10/2022   Procedure: RADIOACTIVE SEED GUIDED EXCISIONAL RIGHT BREAST BIOPSY;  Surgeon: Dareen Ebbing, MD;  Location: [facility];  Service: General;  Laterality: Right;   THYROIDECTOMY, PARTIAL  1973   TONSILLECTOMY     As a child     ALLERGIES:  Allergies  Allergen Reactions   Latex Dermatitis   Codeine Rash   Sulfa Antibiotics Diarrhea    Childhood reaction     CURRENT MEDICATIONS:  Outpatient Encounter Medications as of 02/07/2024  Medication Sig   Cholecalciferol (VITAMIN D) 50 MCG (2000 UT) tablet Take 2,000 Units by mouth daily.   Coenzyme Q10 (CO Q-10) 100 MG CAPS Take 100 mg by mouth daily.   letrozole  (FEMARA ) 2.5 MG tablet Take 1 tablet (2.5 mg total) by mouth daily.   levothyroxine (SYNTHROID) 112 MCG tablet Take 112 mcg by mouth daily before breakfast.   loratadine (CLARITIN) 10 MG tablet Take 10 mg by mouth daily.   losartan (COZAAR) 25 MG tablet Take 25 mg by mouth daily.   meloxicam (MOBIC) 15 MG tablet Take 15 mg by mouth daily.   Multiple Vitamin (MULTIVITAMIN WITH MINERALS) TABS tablet Take 1 tablet by mouth daily.   simvastatin (ZOCOR) 20 MG tablet Take 20 mg by mouth daily.   No  facility-administered encounter medications on file as of 02/07/2024.     ONCOLOGIC FAMILY HISTORY:  Family History  Problem Relation Age of Onset   Breast cancer Mother        dx 12s   Breast cancer Maternal Aunt        dx 60s-70s   Breast cancer Maternal Aunt        dx >50   Uterine cancer Maternal Aunt        dx > 50   Uterine cancer Cousin 50       d. 50     SOCIAL HISTORY:  Social History   Socioeconomic History   Marital status: Married    Spouse name: Not on file   Number of children: Not on file   Years of education: Not on file   Highest education level: Not on file  Occupational History   Not on file  Tobacco Use   Smoking status: Never   Smokeless tobacco: Never  Vaping Use   Vaping status: Never Used  Substance and Sexual Activity   Alcohol use: Yes    Comment: Occasionally  Drug use: No   Sexual activity: Not Currently    Birth control/protection: Surgical    Comment: hyst  Other Topics Concern   Not on file  Social History Narrative   Not on file   Social Drivers of Health   Financial Resource Strain: Not on file  Food Insecurity: Not on file  Transportation Needs: Not on file  Physical Activity: Not on file  Stress: Not on file  Social Connections: Unknown (01/16/2022)   Received from [facility]   Social Network    Social Network: Not on file  Intimate Partner Violence: Unknown (12/18/2021)   Received from [facility], [facility]   HITS    Physically Hurt: Not on file    Insult or Talk Down To: Not on file    Threaten Physical Harm: Not on file    Scream or Curse: Not on file     OBSERVATIONS/OBJECTIVE:  BP (!) 153/77 (BP Location: Left Arm, Patient Position: Sitting)   Pulse 76   Temp 98.6 F (37 C) (Temporal)   Wt 167 lb 11.2 oz (76.1 kg)   SpO2 98%   BMI 30.67 kg/m  GENERAL: Patient is a well appearing female in no acute distress HEENT:  Sclerae anicteric.  Oropharynx clear and moist. No ulcerations or  evidence of oropharyngeal candidiasis. Neck is supple.  NODES:  No cervical, supraclavicular, or axillary lymphadenopathy palpated.  BREAST EXAM: Right breast is benign, left breast status postlumpectomy, no sign of local recurrence LUNGS:  Clear to auscultation bilaterally.  No wheezes or rhonchi. HEART:  Regular rate and rhythm. No murmur appreciated. ABDOMEN:  Soft, nontender.  Positive, normoactive bowel sounds. No organomegaly palpated. MSK:  No focal spinal tenderness to palpation. Full range of motion bilaterally in the upper extremities. EXTREMITIES:  No peripheral edema.   SKIN:  Clear with no obvious rashes or skin changes. No nail dyscrasia. NEURO:  Nonfocal. Well oriented.  Appropriate affect.   LABORATORY DATA:  None for this visit.  DIAGNOSTIC IMAGING:  None for this visit.      ASSESSMENT AND PLAN:  Ms.. Maciver is a pleasant 83 y.o. female with Stage 1A left breast invasive ductal carcinoma, ER+/PR+/HER2-, diagnosed in 08/2023, treated with lumpectomy, and anti-estrogen therapy with letrozole  beginning in 10/2022.  She presents to the Survivorship Clinic for our initial meeting and routine follow-up post-completion of treatment for breast cancer.    1. Stage 1A left breast cancer:  Ms. Selkirk is continuing to recover from definitive treatment for breast cancer. She will follow-up with her medical oncologist, Dr.  Arno Bibles in 6 months with history and physical exam per surveillance protocol.  She will continue her anti-estrogen therapy with letrozole . Thus far, she is tolerating the letrozole  well, with minimal side effects. Her mammogram is due 08/2024; orders placed today.   Today, a comprehensive survivorship care plan and treatment summary was reviewed with the patient today detailing her breast cancer diagnosis, treatment course, potential late/long-term effects of treatment, appropriate follow-up care with recommendations for the future, and patient education resources.  A copy  of this summary, along with a letter will be sent to the patient's primary care provider via mail/fax/In Basket message after today's visit.    2. Bone health:  Given Ms. Schwalbe's age/history of breast cancer and her current treatment regimen including anti-estrogen therapy with letrozole , she is at risk for bone demineralization.  Her last DEXA scan was 11/23/2023 and was normal.  Repeat is recommended in 2 years.  She was given education on specific activities to promote bone health.  3. Cancer screening:  Due to Ms. Armenti's history and her age, she should receive screening for skin cancers.  The information and recommendations are listed on the patient's comprehensive care plan/treatment summary and were reviewed in detail with the patient.    4. Health maintenance and wellness promotion: Ms. Mester was encouraged to consume 5-7 servings of fruits and vegetables per day. We reviewed the "Nutrition Rainbow" handout.  She was also encouraged to engage in moderate to vigorous exercise for 30 minutes per day most days of the week.  She was instructed to limit her alcohol consumption and continue to abstain from tobacco use.     5. Support services/counseling: It is not uncommon for this period of the patient's cancer care trajectory to be one of many emotions and stressors.   She was given information regarding our available services and encouraged to contact me with any questions or for help enrolling in any of our support group/programs.    Follow up instructions:    -Return to cancer center in 6 months for f/u with Dr. Arno Bibles  -Mammogram due in 08/2024 -DEXA due 11/2025 -She is welcome to return back to the Survivorship Clinic at any time; no additional follow-up needed at this time.  -Consider referral back to survivorship as a long-term survivor for continued surveillance  The patient was provided an opportunity to ask questions and all were answered. The patient agreed with the plan and  demonstrated an understanding of the instructions.   Total encounter time:40 minutes*in face-to-face visit time, chart review, lab review, care coordination, order entry, and documentation of the encounter time.    Alwin Baars, NP 02/07/24 9:18 AM Medical Oncology and Hematology [facility] [address] [place], [place] [address] Tel. [phone]    Fax. [phone]  *Total Encounter Time as defined by the Centers for Medicare and Medicaid Services includes, in addition to the face-to-face time of a patient visit (documented in the note above) non-face-to-face time: obtaining and reviewing outside history, ordering and reviewing medications, tests or procedures, care coordination (communications with other health care professionals or caregivers) and documentation in the medical record.

## 2024-02-08 DIAGNOSIS — I10 Essential (primary) hypertension: Secondary | ICD-10-CM | POA: Diagnosis not present

## 2024-02-08 DIAGNOSIS — Z0000 Encounter for general adult medical examination without abnormal findings: Secondary | ICD-10-CM | POA: Diagnosis not present

## 2024-02-08 DIAGNOSIS — E7800 Pure hypercholesterolemia, unspecified: Secondary | ICD-10-CM | POA: Diagnosis not present

## 2024-02-08 DIAGNOSIS — H6123 Impacted cerumen, bilateral: Secondary | ICD-10-CM | POA: Diagnosis not present

## 2024-02-08 DIAGNOSIS — M25561 Pain in right knee: Secondary | ICD-10-CM | POA: Diagnosis not present

## 2024-02-08 DIAGNOSIS — H9190 Unspecified hearing loss, unspecified ear: Secondary | ICD-10-CM | POA: Diagnosis not present

## 2024-02-08 DIAGNOSIS — Z23 Encounter for immunization: Secondary | ICD-10-CM | POA: Diagnosis not present

## 2024-02-08 DIAGNOSIS — E039 Hypothyroidism, unspecified: Secondary | ICD-10-CM | POA: Diagnosis not present

## 2024-02-08 DIAGNOSIS — C50412 Malignant neoplasm of upper-outer quadrant of left female breast: Secondary | ICD-10-CM | POA: Diagnosis not present

## 2024-03-07 DIAGNOSIS — M25561 Pain in right knee: Secondary | ICD-10-CM | POA: Diagnosis not present

## 2024-04-10 DIAGNOSIS — I10 Essential (primary) hypertension: Secondary | ICD-10-CM | POA: Diagnosis not present

## 2024-04-25 DIAGNOSIS — D225 Melanocytic nevi of trunk: Secondary | ICD-10-CM | POA: Diagnosis not present

## 2024-04-25 DIAGNOSIS — D492 Neoplasm of unspecified behavior of bone, soft tissue, and skin: Secondary | ICD-10-CM | POA: Diagnosis not present

## 2024-04-25 DIAGNOSIS — L578 Other skin changes due to chronic exposure to nonionizing radiation: Secondary | ICD-10-CM | POA: Diagnosis not present

## 2024-04-25 DIAGNOSIS — C4441 Basal cell carcinoma of skin of scalp and neck: Secondary | ICD-10-CM | POA: Diagnosis not present

## 2024-04-25 DIAGNOSIS — L728 Other follicular cysts of the skin and subcutaneous tissue: Secondary | ICD-10-CM | POA: Diagnosis not present

## 2024-04-25 DIAGNOSIS — L814 Other melanin hyperpigmentation: Secondary | ICD-10-CM | POA: Diagnosis not present

## 2024-04-25 DIAGNOSIS — L821 Other seborrheic keratosis: Secondary | ICD-10-CM | POA: Diagnosis not present

## 2024-04-25 DIAGNOSIS — Z08 Encounter for follow-up examination after completed treatment for malignant neoplasm: Secondary | ICD-10-CM | POA: Diagnosis not present

## 2024-04-25 DIAGNOSIS — Z85828 Personal history of other malignant neoplasm of skin: Secondary | ICD-10-CM | POA: Diagnosis not present

## 2024-05-28 DIAGNOSIS — C50912 Malignant neoplasm of unspecified site of left female breast: Secondary | ICD-10-CM | POA: Diagnosis not present

## 2024-06-26 DIAGNOSIS — Z961 Presence of intraocular lens: Secondary | ICD-10-CM | POA: Diagnosis not present

## 2024-06-26 DIAGNOSIS — H43813 Vitreous degeneration, bilateral: Secondary | ICD-10-CM | POA: Diagnosis not present

## 2024-06-26 DIAGNOSIS — H3562 Retinal hemorrhage, left eye: Secondary | ICD-10-CM | POA: Diagnosis not present

## 2024-06-26 DIAGNOSIS — H35361 Drusen (degenerative) of macula, right eye: Secondary | ICD-10-CM | POA: Diagnosis not present

## 2024-06-26 DIAGNOSIS — H34832 Tributary (branch) retinal vein occlusion, left eye, with macular edema: Secondary | ICD-10-CM | POA: Diagnosis not present

## 2024-07-03 DIAGNOSIS — C4441 Basal cell carcinoma of skin of scalp and neck: Secondary | ICD-10-CM | POA: Diagnosis not present

## 2024-07-27 ENCOUNTER — Telehealth: Payer: Self-pay | Admitting: Hematology and Oncology

## 2024-07-27 NOTE — Telephone Encounter (Signed)
 I left voicemail for patient to return my call if rescheduled appointment from 08/13/2024 to 08/30/2024 with Dr. Loretha does not work for her.

## 2024-08-13 ENCOUNTER — Ambulatory Visit: Admitting: Hematology and Oncology

## 2024-08-20 ENCOUNTER — Ambulatory Visit: Admitting: Hematology and Oncology

## 2024-08-23 ENCOUNTER — Inpatient Hospital Stay: Admission: RE | Admit: 2024-08-23 | Discharge: 2024-08-23 | Attending: Adult Health | Admitting: Adult Health

## 2024-08-23 DIAGNOSIS — Z170 Estrogen receptor positive status [ER+]: Secondary | ICD-10-CM

## 2024-08-30 ENCOUNTER — Inpatient Hospital Stay: Admitting: Hematology and Oncology

## 2024-09-26 ENCOUNTER — Inpatient Hospital Stay: Attending: Hematology and Oncology | Admitting: Hematology and Oncology

## 2024-09-26 VITALS — BP 157/67 | HR 72 | Temp 97.4°F | Resp 16 | Wt 158.2 lb

## 2024-09-26 DIAGNOSIS — Z79811 Long term (current) use of aromatase inhibitors: Secondary | ICD-10-CM | POA: Insufficient documentation

## 2024-09-26 DIAGNOSIS — E039 Hypothyroidism, unspecified: Secondary | ICD-10-CM | POA: Insufficient documentation

## 2024-09-26 DIAGNOSIS — J45909 Unspecified asthma, uncomplicated: Secondary | ICD-10-CM | POA: Insufficient documentation

## 2024-09-26 DIAGNOSIS — Z923 Personal history of irradiation: Secondary | ICD-10-CM | POA: Diagnosis not present

## 2024-09-26 DIAGNOSIS — Z780 Asymptomatic menopausal state: Secondary | ICD-10-CM | POA: Diagnosis not present

## 2024-09-26 DIAGNOSIS — Z803 Family history of malignant neoplasm of breast: Secondary | ICD-10-CM | POA: Insufficient documentation

## 2024-09-26 DIAGNOSIS — Z79899 Other long term (current) drug therapy: Secondary | ICD-10-CM | POA: Insufficient documentation

## 2024-09-26 DIAGNOSIS — C50412 Malignant neoplasm of upper-outer quadrant of left female breast: Secondary | ICD-10-CM | POA: Diagnosis not present

## 2024-09-26 DIAGNOSIS — Z1721 Progesterone receptor positive status: Secondary | ICD-10-CM | POA: Diagnosis not present

## 2024-09-26 DIAGNOSIS — Z170 Estrogen receptor positive status [ER+]: Secondary | ICD-10-CM | POA: Insufficient documentation

## 2024-09-26 DIAGNOSIS — Z85828 Personal history of other malignant neoplasm of skin: Secondary | ICD-10-CM | POA: Diagnosis not present

## 2024-09-26 DIAGNOSIS — Z791 Long term (current) use of non-steroidal anti-inflammatories (NSAID): Secondary | ICD-10-CM | POA: Insufficient documentation

## 2024-09-26 DIAGNOSIS — M1990 Unspecified osteoarthritis, unspecified site: Secondary | ICD-10-CM | POA: Insufficient documentation

## 2024-09-26 DIAGNOSIS — Z8049 Family history of malignant neoplasm of other genital organs: Secondary | ICD-10-CM | POA: Diagnosis not present

## 2024-09-26 DIAGNOSIS — E7800 Pure hypercholesterolemia, unspecified: Secondary | ICD-10-CM | POA: Diagnosis not present

## 2024-09-26 DIAGNOSIS — I10 Essential (primary) hypertension: Secondary | ICD-10-CM | POA: Diagnosis not present

## 2024-09-26 DIAGNOSIS — Z1732 Human epidermal growth factor receptor 2 negative status: Secondary | ICD-10-CM | POA: Diagnosis not present

## 2024-09-26 NOTE — Progress Notes (Signed)
 SURVIVORSHIP VISIT:  BRIEF ONCOLOGIC HISTORY:  Oncology History  Malignant neoplasm of upper-outer quadrant of left breast in female, estrogen receptor positive (HCC)  08/23/2023 Mammogram   Diagnostic mammogram: palpable LEFT axillary lump. Patient with history of remote LEFT breast cancer and lumpectomy and more recent excision of RIGHT breast complex sclerosing lesion. 1.9cm axillary mass versus irregular abnormal lymph node noted, no abnormality noted in either breast, biopsy recommended.    08/24/2023 Initial Biopsy   Left axillary LN biopsy: Ductal carcinoma with solid and papillary features.  ER+, PR+, Ki-67 15%, HER-2 negative   09/23/2023 Breast MRI   0.8cm mass in left lateral posterior breast, 0.8 cm oval mass in central slgihtly medial left breast   09/29/2023 Initial Biopsy   US  biopsy: Left breast needle core biopsy 3 o'clock, 9cmfn: LN with reactive changes, negative for carcinoma.     10/05/2023 Genetic Testing   Negative genetic testing on the CancerNext+RNAinsight panel.  The report date is October 05, 2023.  The [facility] CancerNext+RNAinsight Panel includes sequencing, rearrangement analysis, and RNA analysis for the following 39 genes: APC, ATM, BAP1, BARD1, BMPR1A, BRCA1, BRCA2, BRIP1, CDH1, CDKN2A, CHEK2, FH, FLCN, MET, MLH1, MSH2, MSH6, MUTYH, NF1, NTHL1, PALB2, PMS2, PTEN, RAD51C, RAD51D, SMAD4, STK11, TP53, TSC1, TSC2, and VHL (sequencing and deletion/duplication); AXIN2, HOXB13, MBD4, MSH3, POLD1 and POLE (sequencing only); EPCAM and GREM1 (deletion/duplication only).   10/06/2023 Initial Biopsy   Left breast posterior medial needle core MR biopsy: Benign breast tissue no malignancy   10/2023 -  Anti-estrogen oral therapy   Letrozole    10/18/2023 Surgery   Left breast axillary lumpectomy: solid pappillary carcinoma, 1.9cm, grade 2, margins negative, ER/PR positive, HER2 negative, T1c, N0   10/18/2023 Cancer Staging   Staging form: Breast, AJCC 8th Edition - Pathologic  stage from 10/18/2023: Stage IA (pT1c, pN0, cM0, G2, ER+, PR+, HER2-) - Signed by Crawford Morna Pickle, NP on 02/07/2024 Stage prefix: Initial diagnosis Histologic grading system: 3 grade system     INTERVAL HISTORY:  Kara Pugh to review her survivorship care plan detailing her treatment course for breast cancer, as well as monitoring long-term side effects of that treatment, education regarding health maintenance, screening, and overall wellness and health promotion.     Overall, Kara Pugh reports feeling quite well.  She is taking letrozole  daily and tolerates it well other than an occasional hot flash that is mild and tolerable.  REVIEW OF SYSTEMS:  Review of Systems  Constitutional:  Negative for appetite change, chills, fatigue, fever and unexpected weight change.  HENT:   Negative for hearing loss, lump/mass and trouble swallowing.   Eyes:  Negative for eye problems and icterus.  Respiratory:  Negative for chest tightness, cough and shortness of breath.   Cardiovascular:  Negative for chest pain, leg swelling and palpitations.  Gastrointestinal:  Negative for abdominal distention, abdominal pain, constipation, diarrhea, nausea and vomiting.  Endocrine: Positive for hot flashes.  Genitourinary:  Negative for difficulty urinating.   Musculoskeletal:  Negative for arthralgias.  Skin:  Negative for itching and rash.  Neurological:  Negative for dizziness, extremity weakness, headaches and numbness.  Hematological:  Negative for adenopathy. Does not bruise/bleed easily.  Psychiatric/Behavioral:  Negative for depression. The patient is not nervous/anxious.    Breast: Denies any new nodularity, masses, tenderness, nipple changes, or nipple discharge.       PAST MEDICAL/SURGICAL HISTORY:  Past Medical History:  Diagnosis Date   Arthritis    knees   Asthma    as  child, no problems now   Breast cancer (HCC) 1990   Left Breast Cancer   Breast cancer (HCC) 09/2023   left breast  IDC   Hepatitis    Hx Hep B 1970's   High cholesterol    Hypertension    Hypothyroidism    Personal history of radiation therapy 1990   Pneumonia    Summer 2023   PONV (postoperative nausea and vomiting)    Thyroid  disease    Past Surgical History:  Procedure Laterality Date   ABDOMINAL HYSTERECTOMY     Total   AXILLARY LYMPH NODE DISSECTION Left 10/18/2023   Procedure: LEFT AXILLARY LUMPECTOMY;  Surgeon: Belinda Cough, MD;  Location: [facility];  Service: General;  Laterality: Left;  LMA   BREAST BIOPSY Right 10/05/2022   US  RT BREAST BX W LOC DEV 1ST LESION IMG BX SPEC US  GUIDE 10/05/2022 [facility]   BREAST BIOPSY  11/09/2022   MM RT RADIOACTIVE SEED LOC MAMMO GUIDE 11/09/2022 [facility]   BREAST BIOPSY Right 01/11/2017   BREAST BIOPSY Left 09/29/2023   US  LT BREAST BX W LOC DEV 1ST LESION IMG BX SPEC US  GUIDE 09/29/2023 [facility]   BREAST LUMPECTOMY Left 1990   CATARACT EXTRACTION W/ INTRAOCULAR LENS IMPLANT Bilateral    COLONOSCOPY     RADIOACTIVE SEED GUIDED EXCISIONAL BREAST BIOPSY Right 11/10/2022   Procedure: RADIOACTIVE SEED GUIDED EXCISIONAL RIGHT BREAST BIOPSY;  Surgeon: Belinda Cough, MD;  Location: [facility];  Service: General;  Laterality: Right;   THYROIDECTOMY, PARTIAL  1973   TONSILLECTOMY     As a child     ALLERGIES:  Allergies  Allergen Reactions   Latex Dermatitis   Codeine Rash   Sulfa Antibiotics Diarrhea    Childhood reaction     CURRENT MEDICATIONS:  Outpatient Encounter Medications as of 09/26/2024  Medication Sig   Cholecalciferol (VITAMIN D) 50 MCG (2000 UT) tablet Take 2,000 Units by mouth daily.   Coenzyme Q10 (CO Q-10) 100 MG CAPS Take 100 mg by mouth daily.   letrozole  (FEMARA ) 2.5 MG tablet Take 1 tablet (2.5 mg total) by mouth daily.   levothyroxine (SYNTHROID) 112 MCG tablet Take 112 mcg by mouth daily before breakfast.   loratadine (CLARITIN) 10 MG tablet Take 10 mg by mouth daily.   losartan  (COZAAR) 25 MG tablet Take 25 mg by mouth daily.   meloxicam (MOBIC) 15 MG tablet Take 15 mg by mouth daily.   Multiple Vitamin (MULTIVITAMIN WITH MINERALS) TABS tablet Take 1 tablet by mouth daily.   simvastatin (ZOCOR) 20 MG tablet Take 20 mg by mouth daily.   No facility-administered encounter medications on file as of 09/26/2024.     ONCOLOGIC FAMILY HISTORY:  Family History  Problem Relation Age of Onset   Breast cancer Mother        dx 80s   Breast cancer Maternal Aunt        dx 60s-70s   Breast cancer Maternal Aunt        dx >50   Uterine cancer Maternal Aunt        dx > 50   Uterine cancer Cousin 50       d. 50     SOCIAL HISTORY:  Social History   Socioeconomic History   Marital status: Married    Spouse name: Not on file   Number of children: Not on file   Years of education: Not on file   Highest education level: Not on  file  Occupational History   Not on file  Tobacco Use   Smoking status: Never   Smokeless tobacco: Never  Vaping Use   Vaping status: Never Used  Substance and Sexual Activity   Alcohol use: Yes    Comment: Occasionally   Drug use: No   Sexual activity: Not Currently    Birth control/protection: Surgical    Comment: hyst  Other Topics Concern   Not on file  Social History Narrative   Not on file   Social Drivers of Health   Tobacco Use: Low Risk  (05/28/2024)   Received from [facility]   Patient History    Passive Exposure: Not on file    Smokeless Tobacco Use: Never    Smoking Tobacco Use: Never  Financial Resource Strain: Not on file  Food Insecurity: Not on file  Transportation Needs: Not on file  Physical Activity: Not on file  Stress: Not on file  Social Connections: Not on file  Intimate Partner Violence: Not on file  Depression (EYV7-0): Not on file  Alcohol Screen: Not on file  Housing: Unknown (09/19/2023)   Received from [facility]   Epic    Unable to Pay for Housing in the  Last Year: Not on file    Number of Times Moved in the Last Year: Not on file    At any time in the past 12 months, were you homeless or living in a shelter (including now)?: No  Utilities: Not on file  Health Literacy: Not on file     OBSERVATIONS/OBJECTIVE:  BP (!) 157/67 (BP Location: Left Arm, Patient Position: Sitting) Comment: 1st BP 162/78 left side; 2nd BP 157/67  Pulse 72   Temp (!) 97.4 F (36.3 C) (Temporal)   Resp 16   Wt 158 lb 3.2 oz (71.8 kg)   SpO2 97%   BMI 28.94 kg/m  GENERAL: Patient is a well appearing female in no acute distress HEENT:  Sclerae anicteric.  Oropharynx clear and moist. No ulcerations or evidence of oropharyngeal candidiasis. Neck is supple.  NODES:  No cervical, supraclavicular, or axillary lymphadenopathy palpated.  BREAST EXAM: Right breast is benign, left breast status postlumpectomy, no sign of local recurrence LUNGS:  Clear to auscultation bilaterally.  No wheezes or rhonchi. HEART:  Regular rate and rhythm. No murmur appreciated. ABDOMEN:  Soft, nontender.  Positive, normoactive bowel sounds. No organomegaly palpated. MSK:  No focal spinal tenderness to palpation. Full range of motion bilaterally in the upper extremities. EXTREMITIES:  No peripheral edema.   SKIN:  Clear with no obvious rashes or skin changes. No nail dyscrasia. NEURO:  Nonfocal. Well oriented.  Appropriate affect.   LABORATORY DATA:  None for this visit.  DIAGNOSTIC IMAGING:  None for this visit.      ASSESSMENT AND PLAN:  Ms.. Pugh is a pleasant 84 y.o. female with Stage 1A left breast invasive ductal carcinoma, ER+/PR+/HER2-, diagnosed in 08/2023, treated with lumpectomy, and anti-estrogen therapy with letrozole  beginning in 10/2022.  She presents to the Survivorship Clinic for our initial meeting and routine follow-up post-completion of treatment for breast cancer.    1. Stage 1A left breast cancer:  Kara Pugh is continuing to recover from definitive treatment  for breast cancer. She will follow-up with her medical oncologist, Dr.  Loretha in 6 months with history and physical exam per surveillance protocol.  She will continue her anti-estrogen therapy with letrozole . Thus far, she is tolerating the letrozole  well, with minimal  side effects. Her mammogram is due 08/2024; orders placed today.   Today, a comprehensive survivorship care plan and treatment summary was reviewed with the patient today detailing her breast cancer diagnosis, treatment course, potential late/long-term effects of treatment, appropriate follow-up care with recommendations for the future, and patient education resources.  A copy of this summary, along with a letter will be sent to the patients primary care provider via mail/fax/In Basket message after todays visit.    2. Bone health:  Given Kara Pugh's age/history of breast cancer and her current treatment regimen including anti-estrogen therapy with letrozole , she is at risk for bone demineralization.  Her last DEXA scan was 11/23/2023 and was normal.  Repeat is recommended in 2 years. She was given education on specific activities to promote bone health.  3. Cancer screening:  Due to Kara Pugh's history and her age, she should receive screening for skin cancers.  The information and recommendations are listed on the patient's comprehensive care plan/treatment summary and were reviewed in detail with the patient.    4. Health maintenance and wellness promotion: Kara Pugh was encouraged to consume 5-7 servings of fruits and vegetables per day. We reviewed the [employer] handout.  She was also encouraged to engage in moderate to vigorous exercise for 30 minutes per day most days of the week.  She was instructed to limit her alcohol consumption and continue to abstain from tobacco use.     5. Support services/counseling: It is not uncommon for this period of the patient's cancer care trajectory to be one of many emotions and stressors.    She was given information regarding our available services and encouraged to contact me with any questions or for help enrolling in any of our support group/programs.    Follow up instructions:    -Return to cancer center in 6 months for f/u with Dr. Loretha  -Mammogram due in 08/2024 -DEXA due 11/2025 -She is welcome to return back to the Survivorship Clinic at any time; no additional follow-up needed at this time.  -Consider referral back to survivorship as a long-term survivor for continued surveillance  The patient was provided an opportunity to ask questions and all were answered. The patient agreed with the plan and demonstrated an understanding of the instructions.   Total encounter time:40 minutes*in face-to-face visit time, chart review, lab review, care coordination, order entry, and documentation of the encounter time.    Morna Kendall, NP 09/26/2024 8:39 AM Medical Oncology and Hematology [facility] [address] [place], [place] [address] Tel. [phone]    Fax. [phone]  *Total Encounter Time as defined by the Centers for Medicare and Medicaid Services includes, in addition to the face-to-face time of a patient visit (documented in the note above) non-face-to-face time: obtaining and reviewing outside history, ordering and reviewing medications, tests or procedures, care coordination (communications with other health care professionals or caregivers) and documentation in the medical record.

## 2024-09-26 NOTE — Progress Notes (Signed)
  Cancer Center CONSULT NOTE  Patient Care Team: Cleotilde Planas, MD as PCP - General (Family Medicine) Loretha Ash, MD as Consulting Physician (Hematology and Oncology) Dewey Rush, MD as Consulting Physician (Radiation Oncology) Belinda Cough, MD as Consulting Physician (General Surgery)  CHIEF COMPLAINTS/PURPOSE OF CONSULTATION:  Newly diagnosed breast cancer  HISTORY OF PRESENTING ILLNESS:  Kara Pugh 84 y.o. female is here because of recent diagnosis of left breast cancer.  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 The patient noticed a lump in the left axillary area a couple of months ago, which has remained unchanged. A mammogram and ultrasound were performed, and a biopsy of the axillary mass suggested breast cancer.  There was no evidence of residual nodal tissue in this biopsy.  Prognostics from this showed ER +95% strong staining, PR +95% strong staining HER2 1+ Ki-67 of 15%.  She then had MRI breast which showed a couple areas of suspicion pending ultrasound-guided biopsy.  The patient's previous breast cancer was treated with a lumpectomy, radiation, and two years of tamoxifen approximately 35 years ago. The patient has a family history of breast cancer, with both her mother and her mother's sister having had the disease.  Discussed the use of AI scribe software for clinical note transcription with the patient, who gave verbal consent to proceed.  History of Present Illness    History of Present Illness Kara Pugh is an 85 year old female with ER/PR-positive breast cancer, status post surgical resection, presenting for routine oncology follow-up.  She underwent surgical resection for ER/PR-positive breast cancer in December 2024 and has been maintained on letrozole  since that time. She remains physically active, walking and exercising regularly, though she did not exercise  during the holidays.  Her most recent mammogram was performed on August 23, 2024, with instructions to repeat in one year. Blood work from May 2025 was reviewed and found to be unremarkable.  She has a history of basal cell carcinoma of the scalp, status post excision.    MEDICAL HISTORY:  Past Medical History:  Diagnosis Date   Arthritis    knees   Asthma    as child, no problems now   Breast cancer (HCC) 1990   Left Breast Cancer   Breast cancer (HCC) 09/2023   left breast IDC   Hepatitis    Hx Hep B 1970's   High cholesterol    Hypertension    Hypothyroidism    Personal history of radiation therapy 1990   Pneumonia    Summer 2023   PONV (postoperative nausea and vomiting)    Thyroid  disease     SURGICAL HISTORY: Past Surgical History:  Procedure Laterality Date   ABDOMINAL HYSTERECTOMY     Total   AXILLARY LYMPH NODE DISSECTION Left 10/18/2023   Procedure: LEFT AXILLARY LUMPECTOMY;  Surgeon: Belinda Cough, MD;  Location: [facility];  Service: General;  Laterality: Left;  LMA   BREAST BIOPSY Right 10/05/2022   US  RT BREAST BX W LOC DEV 1ST LESION IMG BX SPEC US  GUIDE 10/05/2022 [facility]   BREAST BIOPSY  11/09/2022   MM RT RADIOACTIVE SEED LOC MAMMO GUIDE 11/09/2022 [facility]MO[facility]   BREAST BIOPSY Right 01/11/2017   BREAST BIOPSY Left 09/29/2023   US  LT BREAST BX W LOC DEV 1ST LESION IMG BX SPEC US  GUIDE 09/29/2023 [facility]MO[facility]   BREAST LUMPECTOMY Left 1990   CATARACT EXTRACTION W/  INTRAOCULAR LENS IMPLANT Bilateral    COLONOSCOPY     RADIOACTIVE SEED GUIDED EXCISIONAL BREAST BIOPSY Right 11/10/2022   Procedure: RADIOACTIVE SEED GUIDED EXCISIONAL RIGHT BREAST BIOPSY;  Surgeon: Belinda Cough, MD;  Location: [facility];  Service: General;  Laterality: Right;   THYROIDECTOMY, PARTIAL  1973   TONSILLECTOMY     As a child    SOCIAL HISTORY: Social History   Socioeconomic History   Marital status: Married    Spouse name: Not on file    Number of children: Not on file   Years of education: Not on file   Highest education level: Not on file  Occupational History   Not on file  Tobacco Use   Smoking status: Never   Smokeless tobacco: Never  Vaping Use   Vaping status: Never Used  Substance and Sexual Activity   Alcohol use: Yes    Comment: Occasionally   Drug use: No   Sexual activity: Not Currently    Birth control/protection: Surgical    Comment: hyst  Other Topics Concern   Not on file  Social History Narrative   Not on file   Social Drivers of Health   Tobacco Use: Low Risk  (05/28/2024)   Received from [facility]   Patient History    Passive Exposure: Not on file    Smokeless Tobacco Use: Never    Smoking Tobacco Use: Never  Financial Resource Strain: Not on file  Food Insecurity: Not on file  Transportation Needs: Not on file  Physical Activity: Not on file  Stress: Not on file  Social Connections: Not on file  Intimate Partner Violence: Not on file  Depression (EYV7-0): Not on file  Alcohol Screen: Not on file  Housing: Unknown (09/19/2023)   Received from [facility]   Epic    Unable to Pay for Housing in the Last Year: Not on file    Number of Times Moved in the Last Year: Not on file    At any time in the past 12 months, were you homeless or living in a shelter (including now)?: No  Utilities: Not on file  Health Literacy: Not on file    FAMILY HISTORY: Family History  Problem Relation Age of Onset   Breast cancer Mother        dx 84s   Breast cancer Maternal Aunt        dx 60s-70s   Breast cancer Maternal Aunt        dx >50   Uterine cancer Maternal Aunt        dx > 50   Uterine cancer Cousin 50       d. 50    ALLERGIES:  is allergic to latex, codeine, and sulfa antibiotics.  MEDICATIONS:  Current Outpatient Medications  Medication Sig Dispense Refill   Cholecalciferol (VITAMIN D) 50 MCG (2000 UT) tablet Take 2,000 Units by mouth daily.      Coenzyme Q10 (CO Q-10) 100 MG CAPS Take 100 mg by mouth daily.     letrozole  (FEMARA ) 2.5 MG tablet Take 1 tablet (2.5 mg total) by mouth daily. 90 tablet 3   levothyroxine (SYNTHROID) 112 MCG tablet Take 112 mcg by mouth daily before breakfast.     loratadine (CLARITIN) 10 MG tablet Take 10 mg by mouth daily.     losartan (COZAAR) 25 MG tablet Take 25 mg by mouth daily.     meloxicam (MOBIC) 15 MG tablet Take 15 mg by mouth  daily.     Multiple Vitamin (MULTIVITAMIN WITH MINERALS) TABS tablet Take 1 tablet by mouth daily.     simvastatin (ZOCOR) 20 MG tablet Take 20 mg by mouth daily.     No current facility-administered medications for this visit.    REVIEW OF SYSTEMS:   Constitutional: Denies fevers, chills or abnormal night sweats Eyes: Denies blurriness of vision, double vision or watery eyes Ears, nose, mouth, throat, and face: Denies mucositis or sore throat Respiratory: Denies cough, dyspnea or wheezes Cardiovascular: Denies palpitation, chest discomfort or lower extremity swelling Gastrointestinal:  Denies nausea, heartburn or change in bowel habits Skin: Denies abnormal skin rashes Lymphatics: Denies new lymphadenopathy or easy bruising Neurological:Denies numbness, tingling or new weaknesses Behavioral/Psych: Mood is stable, no new changes  Breast: Denies any palpable lumps or discharge All other systems were reviewed with the patient and are negative.  PHYSICAL EXAMINATION: ECOG PERFORMANCE STATUS: 0 - Asymptomatic  Vitals:   09/26/24 0822  BP: (!) 157/67  Pulse: 72  Resp: 16  Temp: (!) 97.4 F (36.3 C)  SpO2: 97%    Filed Weights   09/26/24 0822  Weight: 158 lb 3.2 oz (71.8 kg)     GENERAL:alert, no distress and comfortable BREAST: No palpable masses in the breast.  No palpable masses. NO regional adenopathy.   LABORATORY DATA:  I have reviewed the data as listed Lab Results  Component Value Date   WBC 6.3 02/07/2024   HGB 13.8 02/07/2024   HCT  40.4 02/07/2024   MCV 91.2 02/07/2024   PLT 180 02/07/2024   Lab Results  Component Value Date   NA 134 (L) 02/07/2024   K 4.4 02/07/2024   CL 98 02/07/2024   CO2 30 02/07/2024    RADIOGRAPHIC STUDIES: I have personally reviewed the radiological reports and agreed with the findings in the report.  ASSESSMENT AND PLAN:   This is a very pleasant 84 year old postmenopausal female patient with prior history of left-sided breast cancer about 35 years ago, had lumpectomy adjuvant radiation and 2 years of tamoxifen now presents with a left axillary mass that was self palpated about 2 years ago.    Assessment and Plan Assessment & Plan Estrogen and progesterone receptor positive breast cancer, post-surgical, on letrozole  Post-surgical resection in December 2024. Tolerates letrozole  well. Bone density preserved. Excellent overall health. - Continued letrozole  therapy. - Instructed monthly breast self-examinations and to report changes. - Scheduled next annual follow-up.   All questions were answered. The patient knows to call the clinic with any problems, questions or concerns.    Amber Stalls, MD 09/26/2024

## 2025-09-26 ENCOUNTER — Inpatient Hospital Stay

## 2025-09-26 ENCOUNTER — Inpatient Hospital Stay: Admitting: Hematology and Oncology
